# Patient Record
Sex: Female | Born: 1965 | Race: Black or African American | Hispanic: No | Marital: Single | Smoking: Current every day smoker
Health system: Southern US, Community
[De-identification: ages and names within clinical notes are randomized; demographics above are authoritative.]

## PROBLEM LIST (undated history)

## (undated) DIAGNOSIS — M1990 Unspecified osteoarthritis, unspecified site: Secondary | ICD-10-CM

## (undated) HISTORY — DX: Unspecified osteoarthritis, unspecified site: M19.90

---

## 1997-03-25 ENCOUNTER — Ambulatory Visit (HOSPITAL_COMMUNITY): Admission: RE | Admit: 1997-03-25 | Discharge: 1997-03-25 | Payer: Self-pay | Admitting: Obstetrics

## 1997-03-28 ENCOUNTER — Ambulatory Visit (HOSPITAL_COMMUNITY): Admission: RE | Admit: 1997-03-28 | Discharge: 1997-03-28 | Payer: Self-pay | Admitting: Obstetrics

## 1997-06-25 ENCOUNTER — Other Ambulatory Visit: Admission: RE | Admit: 1997-06-25 | Discharge: 1997-06-25 | Payer: Self-pay | Admitting: Obstetrics

## 1997-07-15 ENCOUNTER — Inpatient Hospital Stay (HOSPITAL_COMMUNITY): Admission: AD | Admit: 1997-07-15 | Discharge: 1997-07-17 | Payer: Self-pay | Admitting: Obstetrics

## 2011-05-27 ENCOUNTER — Encounter (HOSPITAL_COMMUNITY): Payer: Self-pay | Admitting: Emergency Medicine

## 2011-05-27 ENCOUNTER — Emergency Department (HOSPITAL_COMMUNITY)
Admission: EM | Admit: 2011-05-27 | Discharge: 2011-05-27 | Disposition: A | Discharge status: Home / Self Care | Payer: Self-pay | Attending: Emergency Medicine | Admitting: Emergency Medicine

## 2011-05-27 DIAGNOSIS — R112 Nausea with vomiting, unspecified: Secondary | ICD-10-CM | POA: Insufficient documentation

## 2011-05-27 DIAGNOSIS — R000 Tachycardia, unspecified: Secondary | ICD-10-CM | POA: Insufficient documentation

## 2011-05-27 DIAGNOSIS — R1013 Epigastric pain: Secondary | ICD-10-CM | POA: Insufficient documentation

## 2011-05-27 LAB — COMPREHENSIVE METABOLIC PANEL
ALT: 10 U/L (ref 0–35)
AST: 16 U/L (ref 0–37)
Albumin: 4.8 g/dL (ref 3.5–5.2)
Alkaline Phosphatase: 55 U/L (ref 39–117)
BUN: 9 mg/dL (ref 6–23)
CO2: 26 mEq/L (ref 19–32)
Calcium: 10.1 mg/dL (ref 8.4–10.5)
Chloride: 100 mEq/L (ref 96–112)
Creatinine, Ser: 0.78 mg/dL (ref 0.50–1.10)
GFR calc Af Amer: >90 mL/min (ref >90)
GFR calc non Af Amer: >90 mL/min (ref >90)
Glucose, Bld: 73 mg/dL (ref 70–99)
Potassium: 3.8 mEq/L (ref 3.5–5.1)
Sodium: 138 mEq/L (ref 135–145)
Total Bilirubin: 0.5 mg/dL (ref 0.3–1.2)
Total Protein: 8.6 g/dL — ABNORMAL HIGH (ref 6.0–8.3)

## 2011-05-27 LAB — URINALYSIS, ROUTINE W REFLEX MICROSCOPIC
Bilirubin Urine: NEGATIVE
Glucose, UA: NEGATIVE mg/dL
Hgb urine dipstick: NEGATIVE
Ketones, ur: 15 mg/dL — AB
Nitrite: NEGATIVE
Protein, ur: NEGATIVE mg/dL
Specific Gravity, Urine: 1.018 (ref 1.005–1.030)
Urobilinogen, UA: 0.2 mg/dL (ref 0.0–1.0)
pH: 6.5 (ref 5.0–8.0)

## 2011-05-27 LAB — CBC
HCT: 42.6 % (ref 36.0–46.0)
Hemoglobin: 14.4 g/dL (ref 12.0–15.0)
MCH: 31.9 pg (ref 26.0–34.0)
MCHC: 33.8 g/dL (ref 30.0–36.0)
MCV: 94.2 fL (ref 78.0–100.0)
Platelets: 402 10*3/uL — ABNORMAL HIGH (ref 150–400)
RBC: 4.52 MIL/uL (ref 3.87–5.11)
RDW: 14.3 % (ref 11.5–15.5)
WBC: 12.5 10*3/uL — ABNORMAL HIGH (ref 4.0–10.5)

## 2011-05-27 LAB — POCT PREGNANCY, URINE: Preg Test, Ur: NEGATIVE

## 2011-05-27 LAB — URINE MICROSCOPIC-ADD ON

## 2011-05-27 LAB — LIPASE, BLOOD: Lipase: 44 U/L (ref 11–59)

## 2011-05-27 MED ORDER — SODIUM CHLORIDE 0.9 % IV BOLUS (SEPSIS)
1000.0000 mL | Freq: Once | INTRAVENOUS | Status: AC
  Administered 2011-05-27: 1000 mL via INTRAVENOUS

## 2011-05-27 MED ORDER — ONDANSETRON HCL 4 MG/2ML IJ SOLN
4.0000 mg | Freq: Once | INTRAMUSCULAR | Status: AC
  Administered 2011-05-27: 4 mg via INTRAVENOUS
  Filled 2011-05-27: qty 2

## 2011-05-27 MED ORDER — GI COCKTAIL ~~LOC~~
10.0000 mL | Freq: Once | ORAL | Status: AC
  Administered 2011-05-27: 10 mL via ORAL
  Filled 2011-05-27: qty 30

## 2011-05-27 MED ORDER — ONDANSETRON HCL 4 MG PO TABS: 4.0000 mg | ORAL_TABLET | Freq: Four times a day (QID) | ORAL | Status: AC

## 2011-05-27 NOTE — ED Notes (Signed)
N/v since tues states that she has abd pain to upper abd area, no sob,

## 2011-05-27 NOTE — ED Provider Notes (Addendum)
History    46yF with abdominal pain. Gradual onset 3d ago. Epigastric region with radiation to back. Relatively constant. No appreciable exacerbating or relieving factors. Multiple episodes of vomiting. Can't remember if pain or vomiting began first. No fever or chills. Some streaks of blood yesterday when vomiting which resolved. No coffee grounds. Drinks most weekends. Denies hx of pancreatitis. Denies hx of abdominal surgery. No sick contacts. No diarrhea.  CSN: [id number]  Arrival date & time 05/27/11  1236   First MD Initiated Contact with Patient 05/27/11 1456      Chief Complaint  Patient presents with  . Abdominal Pain  . Nausea  . Emesis    (Consider location/radiation/quality/duration/timing/severity/associated sxs/prior treatment) HPI  History reviewed. No pertinent past medical history.  History reviewed. No pertinent past surgical history.  No family history on file.  History  Substance Use Topics  . Smoking status: Not on file  . Smokeless tobacco: Not on file  . Alcohol Use: Not on file    OB History    Grav Para Term Preterm Abortions TAB SAB Ect Mult Living                  Review of Systems   Review of symptoms negative unless otherwise noted in HPI.   Allergies  Review of patient's allergies indicates no known allergies.  Home Medications  No current outpatient prescriptions on file.  BP 124/76  Pulse 110  Temp(Src) 98.5 F (36.9 C) (Oral)  Resp 22  SpO2 100%  Physical Exam  Nursing note and vitals reviewed. Constitutional: She appears well-developed and well-nourished. No distress.       Sitting on edge of bed. Nad.   HENT:  Head: Normocephalic and atraumatic.  Eyes: Conjunctivae are normal. Right eye exhibits no discharge. Left eye exhibits no discharge.  Neck: Neck supple.  Cardiovascular: Regular rhythm and normal heart sounds.  Exam reveals no gallop and no friction rub.   No murmur heard.      mildly tachy with reg rhythm.    Pulmonary/Chest: Effort normal and breath sounds normal. No respiratory distress.  Abdominal: Soft. She exhibits no distension. There is tenderness.       Mild tenderness in epigastrium without guarding or rebound. No distension.  Genitourinary:       No cva tenderness  Musculoskeletal: She exhibits no edema and no tenderness.  Neurological: She is alert.  Skin: Skin is warm and dry. She is not diaphoretic.  Psychiatric: She has a normal mood and affect. Her behavior is normal. Thought content normal.    ED Course  Procedures (including critical care time)  Labs Reviewed  URINALYSIS, ROUTINE W REFLEX MICROSCOPIC - Abnormal; Notable for the following:    APPearance CLOUDY (*)    Ketones, ur 15 (*)    Leukocytes, UA MODERATE (*)    All other components within normal limits  COMPREHENSIVE METABOLIC PANEL - Abnormal; Notable for the following:    Total Protein 8.6 (*)    All other components within normal limits  CBC - Abnormal; Notable for the following:    WBC 12.5 (*)    Platelets 402 (*)    All other components within normal limits  URINE MICROSCOPIC-ADD ON - Abnormal; Notable for the following:    Squamous Epithelial / LPF FEW (*)    All other components within normal limits  POCT PREGNANCY, URINE  LIPASE, BLOOD  URINALYSIS, ROUTINE W REFLEX MICROSCOPIC   No results found.   1. Abdominal pain  2. Nausea and vomiting    4:34 PM Pt reassessed. Feels much better. No new complaints. Repeat abdominal exam unchanged.   EKG:  Rhythm:nsr Rate: 68 Axis: normal Intervals: normal ST segments: normal  MDM  46yf with abdominal pain and n/v. Suspect viral illness. Low suspicion for surgical abdomen. Pt reports feeling better after meds. W/u failry unremarkable . Return precautions discussed. Plan symptomatic tx. Zofran for n/v. outpt fu.        Raeford Razor, MD 05/27/11 1635  Raeford Razor, MD 05/27/11 1705

## 2011-05-27 NOTE — Discharge Instructions (Signed)
Abdominal Pain Abdominal pain can be caused by many things. Your caregiver decides the seriousness of your pain by an examination and possibly blood tests and X-rays. Many cases can be observed and treated at home. Most abdominal pain is not caused by a disease and will probably improve without treatment. However, in many cases, more time must pass before a clear cause of the pain can be found. Before that point, it may not be known if you need more testing, or if hospitalization or surgery is needed. HOME CARE INSTRUCTIONS   Do not take laxatives unless directed by your caregiver.   Take pain medicine only as directed by your caregiver.   Only take over-the-counter or prescription medicines for pain, discomfort, or fever as directed by your caregiver.   Try a clear liquid diet (broth, tea, or water) for as long as directed by your caregiver. Slowly move to a bland diet as tolerated.  SEEK IMMEDIATE MEDICAL CARE IF:   The pain does not go away.   You have a fever.   You keep throwing up (vomiting).   The pain is felt only in portions of the abdomen. Pain in the right side could possibly be appendicitis. In an adult, pain in the left lower portion of the abdomen could be colitis or diverticulitis.   You pass bloody or black tarry stools.  MAKE SURE YOU:   Understand these instructions.   Will watch your condition.   Will get help right away if you are not doing well or get worse.  Document Released: 11/10/2004 Document Revised: 01/20/2011 Document Reviewed: 09/19/2007 ExitCare Patient Information 2012 [employer].Nausea and Vomiting Nausea means you feel sick to your stomach. Throwing up (vomiting) is a reflex where stomach contents come out of your mouth. HOME CARE   Take medicine as told by your doctor.   Do not force yourself to eat. However, you do need to drink fluids.   If you feel like eating, eat a normal diet as told by your doctor.   Eat rice, wheat, potatoes,  bread, lean meats, yogurt, fruits, and vegetables.   Avoid high-fat foods.   Drink enough fluids to keep your pee (urine) clear or pale yellow.   Ask your doctor how to replace body fluid losses (rehydrate). Signs of body fluid loss (dehydration) include:   Feeling very thirsty.   Dry lips and mouth.   Feeling dizzy.   Dark pee.   Peeing less than normal.   Feeling confused.   Fast breathing or heart rate.  GET HELP RIGHT AWAY IF:   You have blood in your throw up.   You have black or bloody poop (stool).   You have a bad headache or stiff neck.   You feel confused.   You have bad belly (abdominal) pain.   You have chest pain or trouble breathing.   You do not pee at least once every 8 hours.   You have cold, clammy skin.   You keep throwing up after 24 to 48 hours.   You have a fever.  MAKE SURE YOU:   Understand these instructions.   Will watch your condition.   Will get help right away if you are not doing well or get worse.  Document Released: 07/20/2007 Document Revised: 01/20/2011 Document Reviewed: 07/02/2010 ExitCare Patient Information 2012 [employer].  RESOURCE GUIDE  Dental Problems  Patients with Medicaid: [facility]                       [facility] [address]                                           [address] Phone:  [phone]                                                  Phone:  [phone]  If unable to pay or uninsured, contact:  [facility] or [facility] to become qualified for the adult dental clinic.  Chronic Pain Problems Contact [facility]  [phone] Patients need to be referred by their primary care doctor.  Insufficient Money for Medicine Contact [employer]:  call "211" or [facility] 271-5999.  No Primary Care Doctor Call [facility]  832-8000 Other agencies that provide inexpensive medical care    [facility]   [phone]    [facility]  [phone]    [facility]  [phone]    [facility] Clinic  [phone]    [facility]  [phone]    [facility]  [phone]  Psychological Services [facility]  [phone] [facility]  [phone] [facility]   800 853-5163 (emergency services 641-4993)  Substance Abuse Resources [facility]  336-882-2125 [facility] 336-784-9470 [facility] 336-285-9073 [facility] 336-845-3988 Residential & Outpatient Substance Abuse Program  800-659-3381  Abuse/Neglect [place] Child Abuse Hotline (336) 641-3795 [place] Child Abuse Hotline 800-378-5315 (After Hours)  Emergency Shelter [facility] (336) 271-5985  [facility] (336) 275-9566 [facility] (704) 372-4663  MRSA Hotline #:   832-7006    [place] Resources  [facility] [place]     [employer]                          [facility] [address] [place]                       [address]      [address]  [place]                                                [place]                            [place] Phone:  [phone]                                   Phone:  [phone]                 Phone:  [phone]  [facility] Phone:  [phone]  [place] Child Abuse Hotline (336) 342-1394 (336) 342-3537 (After Hours)   

## 2011-05-27 NOTE — Progress Notes (Signed)
Pt listed as self pay with no insurance coverage Pt confirms she is self pay [place] resident.  CM and [facility] community liaison spoke with her Pt offered [facility] services to assist with finding a [place] self pay provider She accepted information

## 2012-10-26 ENCOUNTER — Emergency Department (HOSPITAL_COMMUNITY): Payer: Self-pay

## 2012-10-26 ENCOUNTER — Emergency Department (HOSPITAL_COMMUNITY)
Admission: EM | Admit: 2012-10-26 | Discharge: 2012-10-27 | Disposition: A | Discharge status: Home / Self Care | Payer: Self-pay | Attending: Emergency Medicine | Admitting: Emergency Medicine

## 2012-10-26 ENCOUNTER — Encounter (HOSPITAL_COMMUNITY): Payer: Self-pay | Admitting: Emergency Medicine

## 2012-10-26 DIAGNOSIS — R112 Nausea with vomiting, unspecified: Secondary | ICD-10-CM | POA: Insufficient documentation

## 2012-10-26 DIAGNOSIS — R197 Diarrhea, unspecified: Secondary | ICD-10-CM | POA: Insufficient documentation

## 2012-10-26 DIAGNOSIS — A599 Trichomoniasis, unspecified: Secondary | ICD-10-CM | POA: Insufficient documentation

## 2012-10-26 DIAGNOSIS — R1013 Epigastric pain: Secondary | ICD-10-CM | POA: Insufficient documentation

## 2012-10-26 DIAGNOSIS — R6883 Chills (without fever): Secondary | ICD-10-CM | POA: Insufficient documentation

## 2012-10-26 DIAGNOSIS — F17200 Nicotine dependence, unspecified, uncomplicated: Secondary | ICD-10-CM | POA: Insufficient documentation

## 2012-10-26 DIAGNOSIS — N898 Other specified noninflammatory disorders of vagina: Secondary | ICD-10-CM | POA: Insufficient documentation

## 2012-10-26 LAB — URINE MICROSCOPIC-ADD ON

## 2012-10-26 LAB — CBC WITH DIFFERENTIAL/PLATELET
Basophils Absolute: 0 10*3/uL (ref 0.0–0.1)
Eosinophils Absolute: 0.1 10*3/uL (ref 0.0–0.7)
Eosinophils Relative: 1 % (ref 0–5)
HCT: 46.9 % — ABNORMAL HIGH (ref 36.0–46.0)
Lymphocytes Relative: 24 % (ref 12–46)
Lymphs Abs: 2.5 10*3/uL (ref 0.7–4.0)
MCH: 32.5 pg (ref 26.0–34.0)
MCV: 93.4 fL (ref 78.0–100.0)
Monocytes Absolute: 0.6 10*3/uL (ref 0.1–1.0)
Platelets: 313 10*3/uL (ref 150–400)
RDW: 14.3 % (ref 11.5–15.5)
WBC: 10.5 10*3/uL (ref 4.0–10.5)

## 2012-10-26 LAB — COMPREHENSIVE METABOLIC PANEL
CO2: 26 mEq/L (ref 19–32)
Calcium: 9.6 mg/dL (ref 8.4–10.5)
Creatinine, Ser: 0.7 mg/dL (ref 0.50–1.10)
GFR calc Af Amer: >90 mL/min (ref >90)
GFR calc non Af Amer: >90 mL/min (ref >90)
Glucose, Bld: 95 mg/dL (ref 70–99)
Total Protein: 8 g/dL (ref 6.0–8.3)

## 2012-10-26 LAB — URINALYSIS, ROUTINE W REFLEX MICROSCOPIC
Nitrite: NEGATIVE
Specific Gravity, Urine: 1.028 (ref 1.005–1.030)
Urobilinogen, UA: 1 mg/dL (ref 0.0–1.0)
pH: 7.5 (ref 5.0–8.0)

## 2012-10-26 LAB — WET PREP, GENITAL
Clue Cells Wet Prep HPF POC: NONE SEEN
Yeast Wet Prep HPF POC: NONE SEEN

## 2012-10-26 MED ORDER — METRONIDAZOLE 500 MG PO TABS: 500.0000 mg | ORAL_TABLET | Freq: Two times a day (BID) | ORAL | Status: DC

## 2012-10-26 MED ORDER — ONDANSETRON HCL 4 MG PO TABS: 4.0000 mg | ORAL_TABLET | Freq: Four times a day (QID) | ORAL | Status: DC

## 2012-10-26 MED ORDER — ONDANSETRON HCL 4 MG/2ML IJ SOLN
4.0000 mg | Freq: Once | INTRAMUSCULAR | Status: AC
  Administered 2012-10-26: 4 mg via INTRAVENOUS
  Filled 2012-10-26: qty 2

## 2012-10-26 MED ORDER — MORPHINE SULFATE 4 MG/ML IJ SOLN
4.0000 mg | Freq: Once | INTRAMUSCULAR | Status: AC
  Administered 2012-10-26: 4 mg via INTRAVENOUS
  Filled 2012-10-26: qty 1

## 2012-10-26 MED ORDER — METRONIDAZOLE 500 MG PO TABS: 2000.0000 mg | ORAL_TABLET | Freq: Once | ORAL | Status: DC

## 2012-10-26 MED ORDER — OMEPRAZOLE 20 MG PO CPDR: 20.0000 mg | DELAYED_RELEASE_CAPSULE | Freq: Every day | ORAL | Status: DC

## 2012-10-26 NOTE — Progress Notes (Signed)
Patient confirms that she does not have a pcp or insurance.  EDCM provided a list of pcps who accept self pay patients, list of financial assistance in the community such as local churches and [employer],  list of discounted pharmacies and website needymeds.org for medication assistance, and information on Medicaid , Affordable Care Act and Inclusive insurance for insurance.  Patient thankful for resources.  No further needs at thid time.

## 2012-10-26 NOTE — ED Notes (Signed)
PA at bedside.

## 2012-10-26 NOTE — ED Notes (Addendum)
Pelvic exam set up and pt. undress

## 2012-10-26 NOTE — ED Notes (Addendum)
Pt reports nausea, emesis, and diarrhea that has occured since Wednesday. Pt also reports abd pain. Pt states that she is unable to keep down fluids. Pt is A/O x4 and in NAD.

## 2012-10-26 NOTE — ED Provider Notes (Signed)
CSN: [id number]     Arrival date & time 10/26/12  1649 History   First MD Initiated Contact with Patient 10/26/12 1816     Chief Complaint  Patient presents with  . Abdominal Pain  . Emesis   (Consider location/radiation/quality/duration/timing/severity/associated sxs/prior Treatment) HPI Comments: Patient presents emergency department with chief complaints of nausea, vomiting, diarrhea. Patient states the symptoms began on Monday, but it recently worsened Wednesday. She states that the pain is worse when she is moving. She states that she is unable to keep down any food or fluids. She denies fever, but states that she has had some chills. She states that she has noticed some mild blood-tinged vomit. Additionally, she endorses cough, productive  for green sputum. She also endorses a few episodes of diarrhea, and new vaginal discharge. She has not tried taking anything to alleviate her symptoms.  The history is provided by the patient. No language interpreter was used.    History reviewed. No pertinent past medical history. History reviewed. No pertinent past surgical history. No family history on file. History  Substance Use Topics  . Smoking status: Current Every Day Smoker -- 0.50 packs/day for 10 years    Types: Cigarettes  . Smokeless tobacco: Never Used  . Alcohol Use: No   OB History   Grav Para Term Preterm Abortions TAB SAB Ect Mult Living                 Review of Systems  All other systems reviewed and are negative.    Allergies  Review of patient's allergies indicates no known allergies.  Home Medications   Current Outpatient Rx  Name  Route  Sig  Dispense  Refill  . aspirin-sod bicarb-citric acid (ALKA-SELTZER) 325 MG TBEF   Oral   Take 325 mg by mouth every 6 (six) hours as needed. pain         . bismuth subsalicylate (PEPTO BISMOL) 262 MG/15ML suspension   Oral   Take 15 mLs by mouth every 6 (six) hours as needed. stomach         . calcium  carbonate-magnesium hydroxide (ROLAIDS) 334 MG CHEW   Oral   Chew 1 tablet by mouth once.          BP 118/78  Pulse 67  Temp(Src) 99.1 F (37.3 C) (Oral)  Resp 16  SpO2 98%  LMP 05/26/2012 Physical Exam  Nursing note and vitals reviewed. Constitutional: She is oriented to person, place, and time. She appears well-developed and well-nourished.  HENT:  Head: Normocephalic and atraumatic.  Eyes: Conjunctivae and EOM are normal. Pupils are equal, round, and reactive to light.  Neck: Normal range of motion. Neck supple.  Cardiovascular: Normal rate and regular rhythm.  Exam reveals no gallop and no friction rub.   No murmur heard. Pulmonary/Chest: Effort normal and breath sounds normal. No respiratory distress. She has no wheezes. She has no rales. She exhibits no tenderness.  Abdominal: Soft. Bowel sounds are normal. She exhibits no distension and no mass. There is no tenderness. There is no rebound and no guarding.  Mid epigastric abdominal tenderness, no other focal abdominal tenderness, no fluid wave, or signs of peritonitis, no rebound, guarding, or masses  Genitourinary: No labial fusion. There is no rash, tenderness, lesion or injury on the right labia. There is no rash, tenderness, lesion or injury on the left labia. Cervix exhibits no motion tenderness, no discharge and no friability. Right adnexum displays no mass, no tenderness and no fullness.  Left adnexum displays no mass, no tenderness and no fullness. No erythema, tenderness or bleeding around the vagina. No foreign body around the vagina. No signs of injury around the vagina. Vaginal discharge found.  Chaperone present during exam, copious white discharge  Musculoskeletal: Normal range of motion. She exhibits no edema and no tenderness.  Neurological: She is alert and oriented to person, place, and time.  Skin: Skin is warm and dry.  Psychiatric: She has a normal mood and affect. Her behavior is normal. Judgment and thought  content normal.    ED Course  Procedures (including critical care time) Labs Review Labs Reviewed  CBC WITH DIFFERENTIAL  COMPREHENSIVE METABOLIC PANEL  URINALYSIS, ROUTINE W REFLEX MICROSCOPIC   Results for orders placed during the hospital encounter of 10/26/12  WET PREP, GENITAL      Result Value Range   Yeast Wet Prep HPF POC NONE SEEN  NONE SEEN   Trich, Wet Prep FEW (*) NONE SEEN   Clue Cells Wet Prep HPF POC NONE SEEN  NONE SEEN   WBC, Wet Prep HPF POC FEW (*) NONE SEEN  CBC WITH DIFFERENTIAL      Result Value Range   WBC 10.5  4.0 - 10.5 K/uL   RBC 5.02  3.87 - 5.11 MIL/uL   Hemoglobin 16.3 (*) 12.0 - 15.0 g/dL   HCT 45.4 (*) 09.8 - 11.9 %   MCV 93.4  78.0 - 100.0 fL   MCH 32.5  26.0 - 34.0 pg   MCHC 34.8  30.0 - 36.0 g/dL   RDW 14.7  82.9 - 56.2 %   Platelets 313  150 - 400 K/uL   Neutrophils Relative % 69  43 - 77 %   Neutro Abs 7.3  1.7 - 7.7 K/uL   Lymphocytes Relative 24  12 - 46 %   Lymphs Abs 2.5  0.7 - 4.0 K/uL   Monocytes Relative 6  3 - 12 %   Monocytes Absolute 0.6  0.1 - 1.0 K/uL   Eosinophils Relative 1  0 - 5 %   Eosinophils Absolute 0.1  0.0 - 0.7 K/uL   Basophils Relative 0  0 - 1 %   Basophils Absolute 0.0  0.0 - 0.1 K/uL  COMPREHENSIVE METABOLIC PANEL      Result Value Range   Sodium 139  135 - 145 mEq/L   Potassium 4.3  3.5 - 5.1 mEq/L   Chloride 101  96 - 112 mEq/L   CO2 26  19 - 32 mEq/L   Glucose, Bld 95  70 - 99 mg/dL   BUN 8  6 - 23 mg/dL   Creatinine, Ser 1.30  0.50 - 1.10 mg/dL   Calcium 9.6  8.4 - 86.5 mg/dL   Total Protein 8.0  6.0 - 8.3 g/dL   Albumin 4.4  3.5 - 5.2 g/dL   AST 21  0 - 37 U/L   ALT 12  0 - 35 U/L   Alkaline Phosphatase 55  39 - 117 U/L   Total Bilirubin 0.5  0.3 - 1.2 mg/dL   GFR calc non Af Amer >90  >90 mL/min   GFR calc Af Amer >90  >90 mL/min  URINALYSIS, ROUTINE W REFLEX MICROSCOPIC      Result Value Range   Color, Urine AMBER (*) YELLOW   APPearance TURBID (*) CLEAR   Specific Gravity, Urine 1.028   1.005 - 1.030   pH 7.5  5.0 - 8.0   Glucose, UA NEGATIVE  NEGATIVE mg/dL   Hgb urine dipstick NEGATIVE  NEGATIVE   Bilirubin Urine SMALL (*) NEGATIVE   Ketones, ur >80 (*) NEGATIVE mg/dL   Protein, ur NEGATIVE  NEGATIVE mg/dL   Urobilinogen, UA 1.0  0.0 - 1.0 mg/dL   Nitrite NEGATIVE  NEGATIVE   Leukocytes, UA SMALL (*) NEGATIVE  LIPASE, BLOOD      Result Value Range   Lipase 38  11 - 59 U/L  URINE MICROSCOPIC-ADD ON      Result Value Range   Squamous Epithelial / LPF RARE  RARE   WBC, UA 0-2  <3 WBC/hpf   Bacteria, UA MANY (*) RARE   Urine-Other AMORPHOUS URATES/PHOSPHATES     Dg Abd Acute W/chest  10/26/2012   CLINICAL DATA:  Nausea, diarrhea  EXAM: ACUTE ABDOMEN SERIES (ABDOMEN 2 VIEW & CHEST 1 VIEW)  COMPARISON:  None.  FINDINGS: Cardiomediastinal silhouette is unremarkable. No acute infiltrate or pleural effusion. No pulmonary edema.  There is nonspecific nonobstructive bowel gas pattern. Some stool are noted in transverse colon and proximal left colon. No pathologic calcifications. No free abdominal air.  IMPRESSION: No acute disease. Nonspecific nonobstructive bowel gas pattern. No free abdominal air.   Electronically Signed   By: Natasha Mead   On: 10/26/2012 19:51     MDM   1. Epigastric pain   2. Trichimoniasis    Patient with several problems, including nausea, vomiting, diarrhea, productive cough, and vaginal discharge. Will check labs, and will reevaluate. Patient appears stable. She is not in any apparent distress. Will give Zofran.  Patient's workup is reassuring. The abdominal pain has persisted in the epigastric region, she did not have any lower abdominal tenderness. Do not suspect surgical abdomen. I feel that a watch and wait. He is appropriate. Vital signs are stable. Patient is not in apparent distress. She is tolerating oral intake.  Patient discussed with Dr. Fayrene Fearing, who has reviewed the workup and labs, and agrees that symptomatic treatment is appropriate at  this time.  Will treat nausea and trich, and recommend outpatient follow-up.  Roxy Horseman, PA-C 10/27/12 0021

## 2012-10-26 NOTE — ED Notes (Signed)
Pt vomiting presently, no fluid challenge not performed

## 2012-10-26 NOTE — ED Notes (Signed)
Fluid challenge cancelled.  Patient actively vomiting.

## 2012-10-27 ENCOUNTER — Encounter (HOSPITAL_COMMUNITY): Payer: Self-pay

## 2012-10-27 ENCOUNTER — Emergency Department (HOSPITAL_COMMUNITY)
Admission: EM | Admit: 2012-10-27 | Discharge: 2012-10-28 | Disposition: A | Discharge status: Home / Self Care | Payer: Self-pay | Attending: Emergency Medicine | Admitting: Emergency Medicine

## 2012-10-27 DIAGNOSIS — F17200 Nicotine dependence, unspecified, uncomplicated: Secondary | ICD-10-CM | POA: Insufficient documentation

## 2012-10-27 DIAGNOSIS — Z79899 Other long term (current) drug therapy: Secondary | ICD-10-CM | POA: Insufficient documentation

## 2012-10-27 DIAGNOSIS — Z3202 Encounter for pregnancy test, result negative: Secondary | ICD-10-CM | POA: Insufficient documentation

## 2012-10-27 DIAGNOSIS — R112 Nausea with vomiting, unspecified: Secondary | ICD-10-CM | POA: Insufficient documentation

## 2012-10-27 DIAGNOSIS — E860 Dehydration: Secondary | ICD-10-CM | POA: Insufficient documentation

## 2012-10-27 LAB — URINALYSIS, ROUTINE W REFLEX MICROSCOPIC
Ketones, ur: >80 mg/dL — AB
Nitrite: NEGATIVE
pH: 7 (ref 5.0–8.0)

## 2012-10-27 LAB — COMPREHENSIVE METABOLIC PANEL
BUN: 11 mg/dL (ref 6–23)
CO2: 28 mEq/L (ref 19–32)
Chloride: 98 mEq/L (ref 96–112)
Creatinine, Ser: 0.77 mg/dL (ref 0.50–1.10)
GFR calc non Af Amer: >90 mL/min (ref >90)
Total Bilirubin: 0.5 mg/dL (ref 0.3–1.2)

## 2012-10-27 LAB — CBC
HCT: 48.5 % — ABNORMAL HIGH (ref 36.0–46.0)
MCV: 92.9 fL (ref 78.0–100.0)
RBC: 5.22 MIL/uL — ABNORMAL HIGH (ref 3.87–5.11)
WBC: 11.7 10*3/uL — ABNORMAL HIGH (ref 4.0–10.5)

## 2012-10-27 LAB — LIPASE, BLOOD: Lipase: 36 U/L (ref 11–59)

## 2012-10-27 LAB — URINE MICROSCOPIC-ADD ON

## 2012-10-27 LAB — TROPONIN I: Troponin I: <0.3 ng/mL (ref <0.30)

## 2012-10-27 MED ORDER — PANTOPRAZOLE SODIUM 40 MG IV SOLR
40.0000 mg | Freq: Once | INTRAVENOUS | Status: AC
  Administered 2012-10-27: 40 mg via INTRAVENOUS
  Filled 2012-10-27: qty 40

## 2012-10-27 MED ORDER — TRAMADOL HCL 50 MG PO TABS: 50.0000 mg | ORAL_TABLET | Freq: Four times a day (QID) | ORAL | Status: DC | PRN

## 2012-10-27 MED ORDER — GI COCKTAIL ~~LOC~~
30.0000 mL | Freq: Once | ORAL | Status: AC
  Administered 2012-10-27: 30 mL via ORAL
  Filled 2012-10-27: qty 30

## 2012-10-27 MED ORDER — CEPHALEXIN 500 MG PO CAPS
500.0000 mg | ORAL_CAPSULE | Freq: Once | ORAL | Status: AC
  Administered 2012-10-28: 500 mg via ORAL
  Filled 2012-10-27: qty 1

## 2012-10-27 MED ORDER — ONDANSETRON 4 MG PO TBDP
4.0000 mg | ORAL_TABLET | Freq: Once | ORAL | Status: AC
  Administered 2012-10-27: 4 mg via ORAL
  Filled 2012-10-27: qty 1

## 2012-10-27 MED ORDER — ONDANSETRON HCL 4 MG/2ML IJ SOLN
4.0000 mg | Freq: Once | INTRAMUSCULAR | Status: AC
  Administered 2012-10-27: 4 mg via INTRAVENOUS
  Filled 2012-10-27: qty 2

## 2012-10-27 MED ORDER — FAMOTIDINE 20 MG PO TABS
20.0000 mg | ORAL_TABLET | Freq: Once | ORAL | Status: AC
  Administered 2012-10-27: 20 mg via ORAL
  Filled 2012-10-27: qty 1

## 2012-10-27 MED ORDER — SODIUM CHLORIDE 0.9 % IV BOLUS (SEPSIS)
1000.0000 mL | Freq: Once | INTRAVENOUS | Status: AC
  Administered 2012-10-27: 1000 mL via INTRAVENOUS

## 2012-10-27 MED ORDER — SODIUM CHLORIDE 0.9 % IV BOLUS (SEPSIS)
1000.0000 mL | Freq: Once | INTRAVENOUS | Status: AC
  Administered 2012-10-28: 1000 mL via INTRAVENOUS

## 2012-10-27 MED ORDER — MORPHINE SULFATE 4 MG/ML IJ SOLN
4.0000 mg | Freq: Once | INTRAMUSCULAR | Status: AC
  Administered 2012-10-27: 4 mg via INTRAVENOUS
  Filled 2012-10-27: qty 1

## 2012-10-27 NOTE — ED Notes (Signed)
Patient is alert and oriented x3.  She was given DC instructions and follow up visit instructions.  Patient gave verbal understanding. She was DC ambulatory under her own power to home.  V/S stable.  He was not showing any signs of distress on DC 

## 2012-10-27 NOTE — ED Provider Notes (Signed)
CSN: [id number]     Arrival date & time 10/27/12  1957 History   First MD Initiated Contact with Patient 10/27/12 2026     Chief Complaint  Patient presents with  . Emesis  . Abdominal Pain   (Consider location/radiation/quality/duration/timing/severity/associated sxs/prior Treatment) Patient is a 47 y.o. female presenting with vomiting and abdominal pain. The history is provided by the patient.  Emesis Associated symptoms: abdominal pain   Associated symptoms: no chills and no headaches   Abdominal Pain Associated symptoms: vomiting   Associated symptoms: no chest pain, no chills, no fever and no shortness of breath   pt c/o nv onset yesterday. Episodic. Several episodes today, yellowish/clear. No bloody emesis. Epigastric pain, dull, cramping/burning. No radiation. Moderate. No abd distension. Denies any prior abd surgery. No fever or chills. Having normal bms, no diarrhea or constipation. No dysuria, hematuria or gu c/o. No lower abd pain or pelvic pain. No vaginal discharge or bleeding. Indicates post-menopausal. Denies hx same symptoms (other than being in ed yesterday for same). No known ill contacts, travel or bad food ingestion. Denies hx pud. No hx gallstones or fam hx gallstones. No hx pancreatitis. Denies etoh use. No cp or sob.      History reviewed. No pertinent past medical history. History reviewed. No pertinent past surgical history. History reviewed. No pertinent family history. History  Substance Use Topics  . Smoking status: Current Every Day Smoker -- 0.50 packs/day for 10 years    Types: Cigarettes  . Smokeless tobacco: Never Used  . Alcohol Use: No   OB History   Grav Para Term Preterm Abortions TAB SAB Ect Mult Living                 Review of Systems  Constitutional: Negative for fever and chills.  HENT: Negative for neck pain.   Eyes: Negative for redness.  Respiratory: Negative for shortness of breath.   Cardiovascular: Negative for chest pain.   Gastrointestinal: Positive for vomiting and abdominal pain.  Genitourinary: Negative for flank pain.  Musculoskeletal: Negative for back pain.  Skin: Negative for rash.  Neurological: Negative for headaches.  Hematological: Does not bruise/bleed easily.  Psychiatric/Behavioral: Negative for confusion.    Allergies  Review of patient's allergies indicates no known allergies.  Home Medications   Current Outpatient Rx  Name  Route  Sig  Dispense  Refill  . aspirin-sod bicarb-citric acid (ALKA-SELTZER) 325 MG TBEF   Oral   Take 325 mg by mouth every 6 (six) hours as needed. pain         . ibuprofen (ADVIL,MOTRIN) 200 MG tablet   Oral   Take 200 mg by mouth every 6 (six) hours as needed for pain.         . magnesium hydroxide (MILK OF MAGNESIA) 400 MG/5ML suspension   Oral   Take 15 mLs by mouth daily as needed for constipation.         . metroNIDAZOLE (FLAGYL) 500 MG tablet   Oral   Take 1 tablet (500 mg total) by mouth 2 (two) times daily.   14 tablet   0   . ondansetron (ZOFRAN) 4 MG tablet   Oral   Take 1 tablet (4 mg total) by mouth every 6 (six) hours.   12 tablet   0   . omeprazole (PRILOSEC) 20 MG capsule   Oral   Take 1 capsule (20 mg total) by mouth daily.   30 capsule   1    BP  121/70  Pulse 77  Temp(Src) 98.8 F (37.1 C) (Oral)  Resp 18  Ht 5\' 4"  (1.626 m)  SpO2 100%  LMP 05/26/2012 Physical Exam  Nursing note and vitals reviewed. Constitutional: She appears well-developed and well-nourished. No distress.  HENT:  Mouth/Throat: Oropharynx is clear and moist.  Eyes: Conjunctivae are normal. No scleral icterus.  Neck: Neck supple. No tracheal deviation present.  Cardiovascular: Normal rate, regular rhythm, normal heart sounds and intact distal pulses.   Pulmonary/Chest: Effort normal and breath sounds normal. No respiratory distress.  Abdominal: Soft. Normal appearance and bowel sounds are normal. She exhibits no distension and no mass.  There is tenderness. There is no rebound and no guarding.  Epigastric tenderness, no rebound or guarding.   Genitourinary:  No cva tenderness  Musculoskeletal: She exhibits no edema and no tenderness.  Neurological: She is alert.  Skin: Skin is warm and dry. No rash noted.  Psychiatric: She has a normal mood and affect.    ED Course  Procedures (including critical care time) Labs Review  Results for orders placed during the hospital encounter of 10/27/12  CBC      Result Value Range   WBC 11.7 (*) 4.0 - 10.5 K/uL   RBC 5.22 (*) 3.87 - 5.11 MIL/uL   Hemoglobin 16.7 (*) 12.0 - 15.0 g/dL   HCT 78.2 (*) 95.6 - 21.3 %   MCV 92.9  78.0 - 100.0 fL   MCH 32.0  26.0 - 34.0 pg   MCHC 34.4  30.0 - 36.0 g/dL   RDW 08.6  57.8 - 46.9 %   Platelets 352  150 - 400 K/uL  COMPREHENSIVE METABOLIC PANEL      Result Value Range   Sodium 140  135 - 145 mEq/L   Potassium 3.7  3.5 - 5.1 mEq/L   Chloride 98  96 - 112 mEq/L   CO2 28  19 - 32 mEq/L   Glucose, Bld 115 (*) 70 - 99 mg/dL   BUN 11  6 - 23 mg/dL   Creatinine, Ser 6.29  0.50 - 1.10 mg/dL   Calcium 52.8  8.4 - 41.3 mg/dL   Total Protein 8.3  6.0 - 8.3 g/dL   Albumin 4.6  3.5 - 5.2 g/dL   AST 15  0 - 37 U/L   ALT 11  0 - 35 U/L   Alkaline Phosphatase 57  39 - 117 U/L   Total Bilirubin 0.5  0.3 - 1.2 mg/dL   GFR calc non Af Amer >90  >90 mL/min   GFR calc Af Amer >90  >90 mL/min  LIPASE, BLOOD      Result Value Range   Lipase 36  11 - 59 U/L  TROPONIN I      Result Value Range   Troponin I <0.30  <0.30 ng/mL  URINALYSIS, ROUTINE W REFLEX MICROSCOPIC      Result Value Range   Color, Urine AMBER (*) YELLOW   APPearance CLOUDY (*) CLEAR   Specific Gravity, Urine 1.035 (*) 1.005 - 1.030   pH 7.0  5.0 - 8.0   Glucose, UA NEGATIVE  NEGATIVE mg/dL   Hgb urine dipstick NEGATIVE  NEGATIVE   Bilirubin Urine SMALL (*) NEGATIVE   Ketones, ur >80 (*) NEGATIVE mg/dL   Protein, ur 30 (*) NEGATIVE mg/dL   Urobilinogen, UA 1.0  0.0 - 1.0  mg/dL   Nitrite NEGATIVE  NEGATIVE   Leukocytes, UA MODERATE (*) NEGATIVE  PREGNANCY, URINE  Result Value Range   Preg Test, Ur NEGATIVE  NEGATIVE  URINE MICROSCOPIC-ADD ON      Result Value Range   Squamous Epithelial / LPF FEW (*) RARE   WBC, UA 3-6  <3 WBC/hpf   RBC / HPF 0-2  <3 RBC/hpf   Bacteria, UA MANY (*) RARE   Urine-Other MUCOUS PRESENT     Dg Abd Acute W/chest  10/26/2012   CLINICAL DATA:  Nausea, diarrhea  EXAM: ACUTE ABDOMEN SERIES (ABDOMEN 2 VIEW & CHEST 1 VIEW)  COMPARISON:  None.  FINDINGS: Cardiomediastinal silhouette is unremarkable. No acute infiltrate or pleural effusion. No pulmonary edema.  There is nonspecific nonobstructive bowel gas pattern. Some stool are noted in transverse colon and proximal left colon. No pathologic calcifications. No free abdominal air.  IMPRESSION: No acute disease. Nonspecific nonobstructive bowel gas pattern. No free abdominal air.   Electronically Signed   By: Natasha Mead   On: 10/26/2012 19:51      MDM  Iv ns bolus. protonix iv. zofran iv. Morphine iv.  Labs.  Reviewed nursing notes and prior charts for additional history.    Recheck no nv. Tolerating po.  Pt states will need a pain rx for home.  Recheck abd soft nt. Afeb.    Date: 10/27/2012  Rate: 60  Rhythm: normal sinus rhythm  QRS Axis: normal  Intervals: normal  ST/T Wave abnormalities: normal  Conduction Disutrbances:none  Narrative Interpretation:   Old EKG Reviewed: none available  Possible uti on labs w mod le, bact, few wbc - will cx.  No dysuria, no cva tenderness or fever. Will rx pending u cx.  Keflex po/rx.  Pt given rx zofran and prilosec yesterday - encouraged to fill rx. Pt had requested pain rx for home, ultram rx provided.   abd soft nt on recheck. Tolerating po.  Pt appears stable for d/c. Will plan recheck 24 hrs if symptoms fail to improve/resolve.     Suzi Roots, MD 10/28/12 [phone]

## 2012-10-27 NOTE — ED Notes (Signed)
Pt was seen yesterday for vomiting and abdominal pain.   Pt given meds but cannot name meds.  States she has been taking them with no relief.

## 2012-10-28 MED ORDER — CEPHALEXIN 500 MG PO CAPS: 500.0000 mg | ORAL_CAPSULE | Freq: Four times a day (QID) | ORAL | Status: DC

## 2012-10-29 LAB — URINE CULTURE: Colony Count: >=100000

## 2012-10-31 NOTE — ED Provider Notes (Signed)
Medical screening examination/treatment/procedure(s) were performed by non-physician practitioner and as supervising physician I was immediately available for consultation/collaboration.   Claudean Kinds, MD 10/31/12 [phone]

## 2017-05-12 ENCOUNTER — Encounter (HOSPITAL_COMMUNITY): Payer: Self-pay | Admitting: *Deleted

## 2017-05-12 ENCOUNTER — Other Ambulatory Visit: Payer: Self-pay

## 2017-05-12 ENCOUNTER — Emergency Department (HOSPITAL_COMMUNITY): Payer: Self-pay

## 2017-05-12 ENCOUNTER — Emergency Department (HOSPITAL_COMMUNITY)
Admission: EM | Admit: 2017-05-12 | Discharge: 2017-05-12 | Disposition: A | Discharge status: Home / Self Care | Payer: Self-pay | Attending: Emergency Medicine | Admitting: Emergency Medicine

## 2017-05-12 DIAGNOSIS — Z79899 Other long term (current) drug therapy: Secondary | ICD-10-CM | POA: Insufficient documentation

## 2017-05-12 DIAGNOSIS — F17210 Nicotine dependence, cigarettes, uncomplicated: Secondary | ICD-10-CM | POA: Insufficient documentation

## 2017-05-12 DIAGNOSIS — A084 Viral intestinal infection, unspecified: Secondary | ICD-10-CM | POA: Insufficient documentation

## 2017-05-12 LAB — URINALYSIS, ROUTINE W REFLEX MICROSCOPIC
Bilirubin Urine: NEGATIVE
GLUCOSE, UA: NEGATIVE mg/dL
HGB URINE DIPSTICK: NEGATIVE
Ketones, ur: 20 mg/dL — AB
LEUKOCYTES UA: NEGATIVE
NITRITE: NEGATIVE
PH: 5 (ref 5.0–8.0)
Protein, ur: 100 mg/dL — AB
SPECIFIC GRAVITY, URINE: 1.029 (ref 1.005–1.030)

## 2017-05-12 LAB — COMPREHENSIVE METABOLIC PANEL
ALT: 33 U/L (ref 14–54)
AST: 43 U/L — AB (ref 15–41)
Albumin: 4.4 g/dL (ref 3.5–5.0)
Alkaline Phosphatase: 61 U/L (ref 38–126)
Anion gap: 14 (ref 5–15)
BILIRUBIN TOTAL: 0.4 mg/dL (ref 0.3–1.2)
BUN: 12 mg/dL (ref 6–20)
CO2: 22 mmol/L (ref 22–32)
CREATININE: 0.84 mg/dL (ref 0.44–1.00)
Calcium: 9.8 mg/dL (ref 8.9–10.3)
Chloride: 100 mmol/L — ABNORMAL LOW (ref 101–111)
Glucose, Bld: 111 mg/dL — ABNORMAL HIGH (ref 65–99)
POTASSIUM: 3.9 mmol/L (ref 3.5–5.1)
Sodium: 136 mmol/L (ref 135–145)
TOTAL PROTEIN: 8.3 g/dL — AB (ref 6.5–8.1)

## 2017-05-12 LAB — LIPASE, BLOOD: LIPASE: 44 U/L (ref 11–51)

## 2017-05-12 LAB — CBC
HCT: 48 % — ABNORMAL HIGH (ref 36.0–46.0)
Hemoglobin: 16.6 g/dL — ABNORMAL HIGH (ref 12.0–15.0)
MCH: 32 pg (ref 26.0–34.0)
MCHC: 34.6 g/dL (ref 30.0–36.0)
MCV: 92.7 fL (ref 78.0–100.0)
PLATELETS: 291 10*3/uL (ref 150–400)
RBC: 5.18 MIL/uL — AB (ref 3.87–5.11)
RDW: 13.7 % (ref 11.5–15.5)
WBC: 9.3 10*3/uL (ref 4.0–10.5)

## 2017-05-12 LAB — I-STAT BETA HCG BLOOD, ED (MC, WL, AP ONLY): I-stat hCG, quantitative: <5 m[IU]/mL (ref <5)

## 2017-05-12 MED ORDER — ALBUTEROL SULFATE (2.5 MG/3ML) 0.083% IN NEBU
3.0000 mL | INHALATION_SOLUTION | Freq: Once | RESPIRATORY_TRACT | Status: AC
  Administered 2017-05-12: 3 mL via RESPIRATORY_TRACT
  Filled 2017-05-12: qty 3

## 2017-05-12 MED ORDER — SODIUM CHLORIDE 0.9 % IV BOLUS
1000.0000 mL | Freq: Once | INTRAVENOUS | Status: AC
  Administered 2017-05-12: 1000 mL via INTRAVENOUS

## 2017-05-12 MED ORDER — DEXAMETHASONE SODIUM PHOSPHATE 10 MG/ML IJ SOLN
10.0000 mg | Freq: Once | INTRAMUSCULAR | Status: AC
  Administered 2017-05-12: 10 mg via INTRAVENOUS

## 2017-05-12 MED ORDER — DEXAMETHASONE SODIUM PHOSPHATE 10 MG/ML IJ SOLN
INTRAMUSCULAR | Status: AC
  Filled 2017-05-12: qty 1

## 2017-05-12 MED ORDER — ONDANSETRON HCL 4 MG/2ML IJ SOLN
4.0000 mg | Freq: Once | INTRAMUSCULAR | Status: AC
  Administered 2017-05-12: 4 mg via INTRAVENOUS

## 2017-05-12 MED ORDER — ONDANSETRON 4 MG PO TBDP
4.0000 mg | ORAL_TABLET | Freq: Once | ORAL | Status: AC | PRN
  Administered 2017-05-12: 4 mg via ORAL
  Filled 2017-05-12: qty 1

## 2017-05-12 MED ORDER — ALBUTEROL SULFATE HFA 108 (90 BASE) MCG/ACT IN AERS: 2.0000 | INHALATION_SPRAY | Freq: Four times a day (QID) | RESPIRATORY_TRACT | 2 refills | Status: DC | PRN

## 2017-05-12 MED ORDER — ONDANSETRON HCL 4 MG PO TABS: 4.0000 mg | ORAL_TABLET | Freq: Every day | ORAL | 1 refills | Status: AC | PRN

## 2017-05-12 MED ORDER — DEXAMETHASONE 10 MG/ML FOR PEDIATRIC ORAL USE
10.0000 mg | Freq: Once | INTRAMUSCULAR | Status: DC
  Filled 2017-05-12 (×2): qty 1

## 2017-05-12 MED ORDER — ONDANSETRON HCL 4 MG/2ML IJ SOLN
INTRAMUSCULAR | Status: AC
  Filled 2017-05-12: qty 2

## 2017-05-12 NOTE — ED Notes (Signed)
Pt verbalized understanding discharge instructions and denies any further needs or questions at this time. VS stable, ambulatory and steady gait.   

## 2017-05-12 NOTE — ED Triage Notes (Addendum)
Pt is here with diarrhea times 24 hours and vomiting started yesterday and pain to lower abdominal side areas.  Pt reports started with cough and cold symptoms.  Pt now reporting sob

## 2017-05-12 NOTE — Discharge Instructions (Signed)
We are sending her home on nausea medication and breathing treatment.  Please have these prescriptions filled and start taking.  You may return to emergency department if you have worsening of your symptoms or other symptoms concerning to you. Please follow-up at [facility]. Take care,

## 2017-05-12 NOTE — ED Provider Notes (Signed)
[facility] EMERGENCY DEPARTMENT Provider Note   CSN: [id number] Arrival date & time: 05/12/17  5102     History   Chief Complaint Chief Complaint  Patient presents with  . Abdominal Pain  . Emesis  . Diarrhea    HPI Jennifer Costa is a 52 y.o. female from shelter with no past medical history who presents with emesis and diarrhea.  Patient reports about 10 episodes of emesis and about 3 episodes of diarrhea yesterday.  She reports some blood tinge on emesis.  Diarrhea without blood.  She reports having cold-like symptoms over the last 4 days.  She also have cough with whitish phlegm.  She denies fever, hemoptysis or chest pain.  She reports some dyspnea.  She has one episode of emesis since this morning.  He has no diarrhea today.  She is still have some nausea and poor appetite. She also reports abdominal pain from coughing a lot.  She denies recent antibiotic use or hospitalization.  Quit smoking about 2-3 years ago.  Denies alcohol or recreational drug use.  She lives in shelter.  HPI  History reviewed. No pertinent past medical history.  There are no active problems to display for this patient.   History reviewed. No pertinent surgical history.   OB History   None      Home Medications    Prior to Admission medications   Medication Sig Start Date End Date Taking? Authorizing Provider  aspirin-sod bicarb-citric acid (ALKA-SELTZER) 325 MG TBEF Take 325 mg by mouth every 6 (six) hours as needed. pain    [provider]  cephALEXin (KEFLEX) 500 MG capsule Take 1 capsule (500 mg total) by mouth 4 (four) times daily. 10/28/12   Lajean Saver, MD  ibuprofen (ADVIL,MOTRIN) 200 MG tablet Take 200 mg by mouth every 6 (six) hours as needed for pain.    [provider]  magnesium hydroxide (MILK OF MAGNESIA) 400 MG/5ML suspension Take 15 mLs by mouth daily as needed for constipation.    [provider]  metroNIDAZOLE (FLAGYL) 500 MG  tablet Take 1 tablet (500 mg total) by mouth 2 (two) times daily. 10/26/12   Montine Circle, PA-C  omeprazole (PRILOSEC) 20 MG capsule Take 1 capsule (20 mg total) by mouth daily. 10/26/12   Montine Circle, PA-C  ondansetron (ZOFRAN) 4 MG tablet Take 1 tablet (4 mg total) by mouth every 6 (six) hours. 10/26/12   Montine Circle, PA-C  traMADol (ULTRAM) 50 MG tablet Take 1 tablet (50 mg total) by mouth every 6 (six) hours as needed for pain. 10/27/12   Lajean Saver, MD    Family History No family history on file.  Social History Social History   Tobacco Use  . Smoking status: Current Every Day Smoker    Packs/day: 0.50    Years: 10.00    Pack years: 5.00    Types: Cigarettes  . Smokeless tobacco: Never Used  Substance Use Topics  . Alcohol use: No  . Drug use: No     Allergies   Patient has no known allergies.   Review of Systems Review of Systems  Constitutional: Negative for appetite change, chills, fatigue, fever and unexpected weight change.  HENT: Positive for congestion and rhinorrhea. Negative for dental problem and trouble swallowing.   Eyes: Negative for visual disturbance.  Respiratory: Positive for cough and shortness of breath. Negative for chest tightness.   Cardiovascular: Negative for chest pain, palpitations and leg swelling.  Gastrointestinal: Positive for abdominal pain,  diarrhea, nausea and vomiting. Negative for blood in stool.  Genitourinary: Negative for difficulty urinating, dysuria and hematuria.  Musculoskeletal: Negative for arthralgias and myalgias.  Skin: Negative for rash.  Neurological: Negative for dizziness and light-headedness.  Psychiatric/Behavioral: Negative for dysphoric mood.   Physical Exam Updated Vital Signs BP 123/77   Pulse (!) 113   Temp 99.3 F (37.4 C) (Oral)   Resp 16   SpO2 94%   Physical Exam GEN: appears uncomfortable. Head: normocephalic and atraumatic  Eyes: conjunctiva without injection. Sclera  anicteric. Ears: external ear, ear canal and TM normal Nares: no rhinorrhea. No swollen turbinates. No erythema of nasal mucosa Oropharynx: MMM. No erythema. No exudation or petechiae.  Uvula midline HEM: negative for cervical or periauricular lymphadenopathies CVS: RRR, nl s1 & s2, no murmurs, no edema, cap refills brisk RESP: no IWOB, good air movement bilaterally, CTAB GI: BS present & normal, soft, tenderness to palpation over RLQ but no rebound.  No palpable mass. GU: no suprapubic or CVA tenderness MSK: no focal tenderness or notable swelling SKIN: no apparent skin lesion ENDO: negative thyromegally NEURO: alert and oiented appropriately, no gross deficits   PSYCH: euthymic mood with congruent affect   ED Treatments / Results  Labs (all labs ordered are listed, but only abnormal results are displayed) Labs Reviewed  COMPREHENSIVE METABOLIC PANEL - Abnormal; Notable for the following components:      Result Value   Chloride 100 (*)    Glucose, Bld 111 (*)    Total Protein 8.3 (*)    AST 43 (*)    All other components within normal limits  CBC - Abnormal; Notable for the following components:   RBC 5.18 (*)    Hemoglobin 16.6 (*)    HCT 48.0 (*)    All other components within normal limits  LIPASE, BLOOD  URINALYSIS, ROUTINE W REFLEX MICROSCOPIC  I-STAT BETA HCG BLOOD, ED ([facility], [facility], [facility] ONLY)    EKG EKG Interpretation  Date/Time:  Friday May 12 2017 10:10:05 EDT Ventricular Rate:  119 PR Interval:  116 QRS Duration: 66 QT Interval:  322 QTC Calculation: 452 R Axis:   57 Text Interpretation:  Sinus tachycardia Biatrial enlargement Abnormal ECG Since last tracing rate faster Otherwise no significant change Confirmed by Deno Etienne [phone]) on 05/12/2017 3:31:43 PM   Radiology Dg Chest 2 View  Result Date: 05/12/2017 CLINICAL DATA:  Shortness of breath with exertion. Productive cough. EXAM: CHEST - 2 VIEW COMPARISON:  Chest x-ray dated November 23, 2015. FINDINGS: The  heart size and mediastinal contours are within normal limits. Both lungs are clear. The visualized skeletal structures are unremarkable. IMPRESSION: No active cardiopulmonary disease. Electronically Signed   By: Titus Dubin M.D.   On: 05/12/2017 10:37    Procedures Procedures (including critical care time)  Medications Ordered in ED Medications  sodium chloride 0.9 % bolus 1,000 mL (has no administration in time range)  ondansetron (ZOFRAN) injection 4 mg (has no administration in time range)  ondansetron (ZOFRAN-ODT) disintegrating tablet 4 mg (4 mg Oral Given 05/12/17 1010)     Initial Impression / Assessment and Plan / ED Course  I have reviewed the triage vital signs and the nursing notes.  Pertinent labs & imaging results that were available during my care of the patient were reviewed by me and considered in my medical decision making (see chart for details).  A 52 year old female with no significant past medical history who presented with emesis and diarrhea likely due to  viral gastroenteritis.  Blood and emesis likely due to Mallory-Weiss.  Symptoms started with viral URI about 4 days ago.  Vital signs significant for tachycardia to 110s. She she is uncomfortable but nontoxic.  Exam with dry lip and RLQ tenderness but no rebound.  He has no suprapubic or CVA tenderness.  She has no urinary symptoms either.  She was given Zofran ODT but threw it up right after.  CBC without leukocytosis.  CMP basically normal.  Checks x-ray without acute finding.  We will give normal saline bolus and IV Zofran followed by PO challenge.  Will give a trial of dexamethasone 10 mg once and albuterol inhaler for dyspnea.  Patient sleeping comfortably when I went by to evaluate. Easily arises. Reports feeling better. She still have some nausea but tolerating oral fluid. No further emesis. Breathing comfortably. Still tachycardic but improved. Tachycardia could be due to albuterol as well.  Urinalysis with  some ketones, protein and trace bacteria.  Ketonuria likely due to dehydration.  Trace bacteria likely contamination given significant amount of squamous cells in the urine.  Discharging patient on albuterol inhaler and Zofran.  Recommended follow-up wellness center.  Return precautions discussed. Final Clinical Impressions(s) / ED Diagnoses   Final diagnoses:  None    ED Discharge Orders    None       Mercy Riding, MD 05/12/17 Kelliher, Laie, DO 05/12/17 2358

## 2017-05-22 NOTE — Congregational Nurse Program (Signed)
Congregational Nurse Program Note  Date of Encounter: 05/22/2017  Past Medical History: No past medical history on file.  Encounter Details: CNP Questionnaire - 05/22/17 1457      Questionnaire   Patient Status  Not Applicable    Race  Black or African American    Location Patient Served At  [employer]    Uninsured  Uninsured (NEW 1x/quarter)    Food  No food insecurities    Housing/Utilities  No permanent housing    Transportation  Yes, need transportation assistance    Interpersonal Safety  Yes, feel physically and emotionally safe where you currently live    Medication  No medication insecurities    Medical Provider  No    Referrals  Other    ED Visit Averted  Not Applicable    Life-Saving Intervention Made  Not Applicable      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 She is only at [facility] until she can get housing for her and her grandchild. She has a job and feels safe where she is at . All services were offered and she understands that there is help if she needs further assistance. [employer] BSN CN PhD. [phone].

## 2017-05-26 ENCOUNTER — Inpatient Hospital Stay: Payer: Self-pay

## 2019-08-19 ENCOUNTER — Emergency Department (HOSPITAL_COMMUNITY): Payer: Medicaid Other

## 2019-08-19 ENCOUNTER — Encounter (HOSPITAL_COMMUNITY): Payer: Self-pay

## 2019-08-19 ENCOUNTER — Emergency Department (HOSPITAL_COMMUNITY)
Admission: EM | Admit: 2019-08-19 | Discharge: 2019-08-19 | Disposition: A | Discharge status: Home / Self Care | Payer: Medicaid Other | Attending: Emergency Medicine | Admitting: Emergency Medicine

## 2019-08-19 ENCOUNTER — Other Ambulatory Visit: Payer: Self-pay

## 2019-08-19 DIAGNOSIS — F17210 Nicotine dependence, cigarettes, uncomplicated: Secondary | ICD-10-CM | POA: Insufficient documentation

## 2019-08-19 DIAGNOSIS — Z7982 Long term (current) use of aspirin: Secondary | ICD-10-CM | POA: Insufficient documentation

## 2019-08-19 DIAGNOSIS — M25531 Pain in right wrist: Secondary | ICD-10-CM | POA: Diagnosis not present

## 2019-08-19 MED ORDER — NAPROXEN 500 MG PO TABS
500.0000 mg | ORAL_TABLET | Freq: Once | ORAL | Status: AC
  Administered 2019-08-19: 12:00:00 500 mg via ORAL
  Filled 2019-08-19: qty 1

## 2019-08-19 MED ORDER — NAPROXEN 500 MG PO TABS: 500.0000 mg | ORAL_TABLET | Freq: Two times a day (BID) | ORAL | 0 refills | Status: DC

## 2019-08-19 NOTE — ED Provider Notes (Signed)
[facility] DEPT Provider Note   CSN: [id number] Arrival date & time: 08/19/19  1113     History Chief Complaint  Patient presents with  . Wrist Pain    Jennifer Costa is a 54 y.o. female.  Jennifer Costa is a 54 y.o. female who is otherwise healthy, presents to the ED for evaluation of right wrist pain.  She reports wrist pain began on Friday.  She denies any trauma or injury to the wrist.  She reports it hurts with certain movements of the wrist.  She is able to move the hand without worsening pain.  Denies any numbness tingling or weakness in the wrist or hand.  Denies any redness swelling or warmth.  No fevers or chills.  Denies history of similar pain.  She has not taken anything to treat pain but did apply an Ace wrap, without much improvement.  She was hoping it would just go away on its own but has not improved.  She reports she does not work, does not know repetitive wrist movements or any other potential inciting event.  No previous injury or surgery.  No other aggravating or alleviating factors.        History reviewed. No pertinent past medical history.  There are no problems to display for this patient.   History reviewed. No pertinent surgical history.   OB History   No obstetric history on file.     History reviewed. No pertinent family history.  Social History   Tobacco Use  . Smoking status: Current Every Day Smoker    Packs/day: 0.50    Years: 10.00    Pack years: 5.00    Types: Cigarettes  . Smokeless tobacco: Never Used  Substance Use Topics  . Alcohol use: No  . Drug use: No    Home Medications Prior to Admission medications   Medication Sig Start Date End Date Taking? Authorizing Provider  albuterol (PROVENTIL HFA;VENTOLIN HFA) 108 (90 Base) MCG/ACT inhaler Inhale 2 puffs into the lungs every 6 (six) hours as needed for wheezing or shortness of breath. 05/12/17   Mercy Riding, MD  aspirin-sod bicarb-citric acid  (ALKA-SELTZER) 325 MG TBEF Take 325 mg by mouth every 6 (six) hours as needed. pain    [provider]  ibuprofen (ADVIL,MOTRIN) 200 MG tablet Take 200 mg by mouth every 6 (six) hours as needed for pain.    [provider]  magnesium hydroxide (MILK OF MAGNESIA) 400 MG/5ML suspension Take 15 mLs by mouth daily as needed for constipation.    [provider]  naproxen (NAPROSYN) 500 MG tablet Take 1 tablet (500 mg total) by mouth 2 (two) times daily. 08/19/19   Jacqlyn Larsen, PA-C  omeprazole (PRILOSEC) 20 MG capsule Take 1 capsule (20 mg total) by mouth daily. 10/26/12   Montine Circle, PA-C  traMADol (ULTRAM) 50 MG tablet Take 1 tablet (50 mg total) by mouth every 6 (six) hours as needed for pain. 10/27/12   Lajean Saver, MD    Allergies    Patient has no known allergies.  Review of Systems   Review of Systems  Constitutional: Negative for chills and fever.  Musculoskeletal: Positive for arthralgias. Negative for joint swelling.  Skin: Negative for color change, rash and wound.  Neurological: Negative for weakness and numbness.    Physical Exam Updated Vital Signs BP 124/84   Pulse 95   Temp 99.4 F (37.4 C) (Oral)   Resp 18   SpO2 99%  Physical Exam Vitals and nursing note reviewed.  Constitutional:      General: She is not in acute distress.    Appearance: Normal appearance. She is well-developed and normal weight. She is not ill-appearing or diaphoretic.     Comments: Well-appearing and in no distress  HENT:     Head: Normocephalic and atraumatic.  Eyes:     General:        Right eye: No discharge.        Left eye: No discharge.  Pulmonary:     Effort: Pulmonary effort is normal. No respiratory distress.  Musculoskeletal:        General: Tenderness present.     Comments: There is some tenderness noted over the right wrist but with no swelling, erythema or warmth, pain made worse with range of motion of the wrist but patient is able to move  fingers without difficulty, 5/5 grip strength and cardinal hand movements intact.  Normal sensation.  No tenderness through the forearm.  Skin:    General: Skin is warm and dry.  Neurological:     Mental Status: She is alert and oriented to person, place, and time.     Coordination: Coordination normal.  Psychiatric:        Mood and Affect: Mood normal.        Behavior: Behavior normal.     ED Results / Procedures / Treatments   Labs (all labs ordered are listed, but only abnormal results are displayed) Labs Reviewed - No data to display  EKG None  Radiology DG Wrist Complete Right  Result Date: 08/19/2019 CLINICAL DATA:  Wrist pain for 3 days EXAM: RIGHT WRIST - COMPLETE 3+ VIEW COMPARISON:  None. FINDINGS: There is no evidence of fracture or dislocation. There is no evidence of arthropathy or other focal bone abnormality. Soft tissues are unremarkable. IMPRESSION: Negative. Electronically Signed   By: Jerilynn Mages.  Shick M.D.   On: 08/19/2019 12:10    Procedures Procedures (including critical care time)  Medications Ordered in ED Medications  naproxen (NAPROSYN) tablet 500 mg (500 mg Oral Given 08/19/19 1206)    ED Course  I have reviewed the triage vital signs and the nursing notes.  Pertinent labs & imaging results that were available during my care of the patient were reviewed by me and considered in my medical decision making (see chart for details).    MDM Rules/Calculators/A&P                          54 year old female presents with 4 days of right wrist pain without traumatic injury, the wrist is not red hot or swollen she has no associated fevers and is able to range the joint so low suspicion for septic arthritis.  X-rays of the wrist are unremarkable.  The wrist is neurovascularly intact.  Suspect potential sprain versus arthritis.  Will treat with anti-inflammatories and wrist brace and have patient follow-up with sports medicine or PCP if not improving.  Patient expresses  understanding and agreement with this plan.  Discharged home in good condition.  Final Clinical Impression(s) / ED Diagnoses Final diagnoses:  Right wrist pain    Rx / DC Orders ED Discharge Orders         Ordered    naproxen (NAPROSYN) 500 MG tablet  2 times daily     Discontinue  Reprint     08/19/19 [place],  Audery Amel, PA-C 08/19/19 1339    Quintella Reichert, MD 08/19/19 [phone]

## 2019-08-19 NOTE — ED Triage Notes (Signed)
Pt reports right wrist pain that started on Friday. Pt denies injury or prior issues with wrist.

## 2019-08-19 NOTE — Discharge Instructions (Addendum)
Your wrist x-rays look good today, and I do not see signs of infection, this may be arthritis or inflammation of the wrist.  Please take naproxen twice daily, you can take at 1000 mg of Tylenol every 8 hours as well to help with this, use wrist brace to help support wrist.  You can use ice and heat intermittently.  If symptoms or not improving please follow-up with sports medicine or your primary care doctor.  If you develop fevers, swelling redness or warmth over the wrist or any other new or concerning symptoms return to the ED.

## 2019-10-16 DIAGNOSIS — Z419 Encounter for procedure for purposes other than remedying health state, unspecified: Secondary | ICD-10-CM | POA: Diagnosis not present

## 2019-11-15 DIAGNOSIS — Z419 Encounter for procedure for purposes other than remedying health state, unspecified: Secondary | ICD-10-CM | POA: Diagnosis not present

## 2019-12-16 DIAGNOSIS — Z419 Encounter for procedure for purposes other than remedying health state, unspecified: Secondary | ICD-10-CM | POA: Diagnosis not present

## 2019-12-30 ENCOUNTER — Encounter: Payer: Self-pay | Admitting: Family Medicine

## 2019-12-30 ENCOUNTER — Other Ambulatory Visit: Payer: Self-pay

## 2019-12-30 ENCOUNTER — Ambulatory Visit (INDEPENDENT_AMBULATORY_CARE_PROVIDER_SITE_OTHER): Payer: Medicaid Other | Admitting: Family Medicine

## 2019-12-30 VITALS — BP 96/62 | HR 92 | Ht 64.0 in | Wt 140.6 lb

## 2019-12-30 DIAGNOSIS — Z131 Encounter for screening for diabetes mellitus: Secondary | ICD-10-CM

## 2019-12-30 DIAGNOSIS — Z114 Encounter for screening for human immunodeficiency virus [HIV]: Secondary | ICD-10-CM | POA: Diagnosis not present

## 2019-12-30 DIAGNOSIS — Z1211 Encounter for screening for malignant neoplasm of colon: Secondary | ICD-10-CM

## 2019-12-30 DIAGNOSIS — Z13220 Encounter for screening for lipoid disorders: Secondary | ICD-10-CM | POA: Diagnosis not present

## 2019-12-30 DIAGNOSIS — R110 Nausea: Secondary | ICD-10-CM

## 2019-12-30 DIAGNOSIS — Z1231 Encounter for screening mammogram for malignant neoplasm of breast: Secondary | ICD-10-CM | POA: Diagnosis not present

## 2019-12-30 DIAGNOSIS — Z1159 Encounter for screening for other viral diseases: Secondary | ICD-10-CM

## 2019-12-30 DIAGNOSIS — Z23 Encounter for immunization: Secondary | ICD-10-CM | POA: Diagnosis not present

## 2019-12-30 MED ORDER — ONDANSETRON HCL 4 MG PO TABS: 4.0000 mg | ORAL_TABLET | Freq: Three times a day (TID) | ORAL | 0 refills | Status: DC | PRN

## 2019-12-30 NOTE — Progress Notes (Signed)
    SUBJECTIVE:   CHIEF COMPLAINT / HPI: Establish care  Nausea Patient reporting nausea for 2 weeks, with 2 episodes of vomiting over the past 2 weeks. Has not been eating or drinking as much due to nausea. No known triggers. Denies abdominal pain, fever, chills, diarrhea, constipation, headache.  Denies hematemesis. No recent travel.  HCM - needs Hep C screening, amenable - needs HIV screening, amenable - needs mammogram, amenable - needs colonoscopy, amenable - needs pap smear, amenable - needs Tdap, amenable - needs Covid vaccines, does not want today  Social history Reports smoking tobacco socially, 4 to 5 cigarettes at a time when going out, on average twice a month.  Drinks wine socially, 2-1/2 glasses when she is out.  Denies recreational drug use. Lives with her grandson had her sisters place.  She has been struggling to get her own housing since 2018. Declines social work assistance.  PERTINENT  PMH / PSH: Asthma, OA  OBJECTIVE:   BP 96/62   Pulse 92   Ht 5\' 4"  (1.626 m)   Wt 140 lb 9.6 oz (63.8 kg)   SpO2 98%   BMI 24.13 kg/m   General: Middle-aged woman, NAD CV: RRR, no murmurs Pulm: CTAB, no wheezes or rales Abdomen: soft, non-tender, non-distended, +BS  Depression screen [facility] 2/9 12/30/2019  Decreased Interest 0  Down, Depressed, Hopeless 0  PHQ - 2 Score 0  Altered sleeping 3  Tired, decreased energy 2  Change in appetite 1  Feeling bad or failure about yourself  1  Trouble concentrating 3  Moving slowly or fidgety/restless 3  Suicidal thoughts 0  PHQ-9 Score 13    ASSESSMENT/PLAN:   Nausea 2 weeks of nausea with few episodes of vomiting.  Etiology unclear, but differential includes gastroparesis (no known history of diabetes), migraine (no headache), increased ICP (no headache), viral gastroenteritis (no diarrhea). Will defer any imaging for now. - labs: CBC w diff, CMP - Zofran prn - f/u 1-2 weeks  Depression Elevated PHQ-9 score of  13.  Reports feelings of feeling bad about herself due to housing issues, unable to secure housing for herself and her grandson and has been trying since 2018. Denies SI. - therapy resources given  HCM - GI referral ordered for colonoscopy - mammogram ordered - patient to schedule pap smear with myself - Tdap given - screening labs: HIV, Hep C, lipid panel, A1c - Covid vaccine declined, will think about it  Zola Button, MD [place]

## 2019-12-30 NOTE — Patient Instructions (Addendum)
It was nice seeing you today, Jennifer Costa.  Today, we talked about your nausea and health screenings.  For your nausea, I am prescribing the medication called Zofran that you can take every 8 hours as needed.  I will also be doing some blood work to look for any causes of nausea.  I have sent a referral for you to have a colonoscopy.  I have also placed an order for you to have a mammogram at the [facility].  Please call them to set up an appointment. Address: [place], [place], [place] [address] Phone: [phone]  I will update you with your blood work results.  Please make a follow-up appointment in the next 1 to 2 weeks.  You can also schedule your Pap smear with me at your convenience.  Stay well, Jennifer Button, MD    Therapy and Counseling Resources Most providers on this list will take Medicaid. Patients with commercial insurance or Medicare should contact their insurance company to get a list of in network providers.  Best[facility]:Psychiatry and Counseling [address] [facility] [address] [place], [place] [address] [place]  [address], [place], [place] [address]      [place] [address]  [place], [place] [address] [phone]  [place] [address] [place]  [place], [place] [address]       [phone]      Jinny Blossom [facility] [address], [place], [place]  Family Solutions:  [place]. [place] [phone]  [facility] Counseling:  [place] [address] [place] [phone]  [facility] (under & uninsured) [address], [place] [phone]    [email or web address]    [place] [address] . [place]    [phone]  [facility] [place]     Phone:   [phone]     [place]  [place] [address]      [place], [place] [address] [place]: [place], [place], [place]   [place] ([place] therapist) [email or web address]  [place] [address]   [place] [address]    [phone]    [facility]   [address],  [place], [place]   [place]  [place]  [facility]  [address] [place], [place]        [phone]  Open Access/Walk In Clinic under & uninsured  [facility]  [address] [place], [place] [facility] [phone]  [facility] [place],  ([place])   [place], [place]: [phone]) 8:30 - 12; 1 - 2:30  Family Service of the [place],  [place], [place]    ([phone]):8:30 - 12; 2 - 3PM  [facility] [employer],  [address],  [place]; [phone]):   Mon - Fri 8 AM - 5 PM  [facility] [place]  MWF 12:30 to 3:00 or call to schedule an appointment  [phone]  Specific Provider options Psychology Today  [email or web address] 1. click on find  a therapist  2. enter your zip code 3. left side and select or tailor a therapist for your specific need.   [facility] Provider Directory [email or web address]  (Medicaid)   Follow all drop down to find a provider  [place] [phone] or [email or web address] [address], [place], [place] Recovery support and educational   24- Hour Availability:  .  [facility]  . [place], [place] Crisis [phone]  . Family Service of the [employer]  [phone]  [facility] Crisis Service  [phone]   . [place]  [phone] (after hours)  . Therapeutic Alternative/Mobile Crisis   [phone]  . [place] National Suicide Hotline  [phone] ([place])  . Call 911 or go to emergency room  . [employer]  [phone]);  [place] and [employer]   . [facility] ACCESS  [phone]); [place], [place], [place], [place], [place], [place], [place]

## 2019-12-31 LAB — LIPID PANEL

## 2019-12-31 LAB — HIV ANTIBODY (ROUTINE TESTING W REFLEX): HIV Screen 4th Generation wRfx: NONREACTIVE

## 2019-12-31 NOTE — Addendum Note (Signed)
Addended by: Zola Button D on: 12/31/2019 09:20 AM   Modules accepted: Level of Service

## 2020-01-01 ENCOUNTER — Encounter: Payer: Self-pay | Admitting: Family Medicine

## 2020-01-01 LAB — CBC WITH DIFFERENTIAL/PLATELET
Basophils Absolute: 0.1 10*3/uL (ref 0.0–0.2)
Basos: 1 %
EOS (ABSOLUTE): 0.5 10*3/uL — ABNORMAL HIGH (ref 0.0–0.4)
Eos: 5 %
Hematocrit: 43.8 % (ref 34.0–46.6)
Hemoglobin: 15.1 g/dL (ref 11.1–15.9)
Immature Grans (Abs): 0 10*3/uL (ref 0.0–0.1)
Immature Granulocytes: 0 %
Lymphocytes Absolute: 3.7 10*3/uL — ABNORMAL HIGH (ref 0.7–3.1)
Lymphs: 37 %
MCH: 32.5 pg (ref 26.6–33.0)
MCHC: 34.5 g/dL (ref 31.5–35.7)
MCV: 94 fL (ref 79–97)
Monocytes Absolute: 0.6 10*3/uL (ref 0.1–0.9)
Monocytes: 6 %
Neutrophils Absolute: 4.9 10*3/uL (ref 1.4–7.0)
Neutrophils: 51 %
Platelets: 392 10*3/uL (ref 150–450)
RBC: 4.64 x10E6/uL (ref 3.77–5.28)
RDW: 13.3 % (ref 11.7–15.4)
WBC: 9.8 10*3/uL (ref 3.4–10.8)

## 2020-01-01 LAB — COMPREHENSIVE METABOLIC PANEL
ALT: 12 IU/L (ref 0–32)
AST: 17 IU/L (ref 0–40)
Albumin/Globulin Ratio: 1.8 (ref 1.2–2.2)
Albumin: 4.6 g/dL (ref 3.8–4.9)
Alkaline Phosphatase: 75 IU/L (ref 44–121)
BUN/Creatinine Ratio: 11 (ref 9–23)
BUN: 9 mg/dL (ref 6–24)
Bilirubin Total: 0.4 mg/dL (ref 0.0–1.2)
CO2: 20 mmol/L (ref 20–29)
Calcium: 9.8 mg/dL (ref 8.7–10.2)
Chloride: 100 mmol/L (ref 96–106)
Creatinine, Ser: 0.83 mg/dL (ref 0.57–1.00)
GFR calc Af Amer: 92 mL/min/{1.73_m2} (ref >59)
GFR calc non Af Amer: 80 mL/min/{1.73_m2} (ref >59)
Globulin, Total: 2.6 g/dL (ref 1.5–4.5)
Glucose: 83 mg/dL (ref 65–99)
Potassium: 4.1 mmol/L (ref 3.5–5.2)
Sodium: 138 mmol/L (ref 134–144)
Total Protein: 7.2 g/dL (ref 6.0–8.5)

## 2020-01-01 LAB — HEMOGLOBIN A1C
Est. average glucose Bld gHb Est-mCnc: 111 mg/dL
Hgb A1c MFr Bld: 5.5 % (ref 4.8–5.6)

## 2020-01-01 LAB — HCV INTERPRETATION

## 2020-01-01 LAB — LIPID PANEL
Chol/HDL Ratio: 2.6 ratio (ref 0.0–4.4)
Cholesterol, Total: 161 mg/dL (ref 100–199)
LDL Chol Calc (NIH): 84 mg/dL (ref 0–99)
Triglycerides: 70 mg/dL (ref 0–149)
VLDL Cholesterol Cal: 14 mg/dL (ref 5–40)

## 2020-01-01 LAB — HCV AB W REFLEX TO QUANT PCR: HCV Ab: <0.1 s/co ratio (ref 0.0–0.9)

## 2020-01-01 NOTE — Progress Notes (Signed)
Result letter sent to patient, labs unremarkable.

## 2020-01-03 ENCOUNTER — Encounter: Payer: Self-pay | Admitting: Gastroenterology

## 2020-01-15 ENCOUNTER — Encounter: Payer: Self-pay | Admitting: Family Medicine

## 2020-01-15 ENCOUNTER — Other Ambulatory Visit: Payer: Self-pay

## 2020-01-15 ENCOUNTER — Ambulatory Visit (INDEPENDENT_AMBULATORY_CARE_PROVIDER_SITE_OTHER): Payer: Medicaid Other | Admitting: Family Medicine

## 2020-01-15 VITALS — BP 108/60 | HR 92 | Ht 64.0 in | Wt 138.2 lb

## 2020-01-15 DIAGNOSIS — R634 Abnormal weight loss: Secondary | ICD-10-CM

## 2020-01-15 DIAGNOSIS — Z419 Encounter for procedure for purposes other than remedying health state, unspecified: Secondary | ICD-10-CM | POA: Diagnosis not present

## 2020-01-15 DIAGNOSIS — M546 Pain in thoracic spine: Secondary | ICD-10-CM

## 2020-01-15 DIAGNOSIS — R110 Nausea: Secondary | ICD-10-CM | POA: Diagnosis not present

## 2020-01-15 DIAGNOSIS — L84 Corns and callosities: Secondary | ICD-10-CM

## 2020-01-15 HISTORY — DX: Abnormal weight loss: R63.4

## 2020-01-15 NOTE — Progress Notes (Addendum)
    SUBJECTIVE:   CHIEF COMPLAINT / HPI: f/u nausea  Nausea Most recently seen 2 weeks ago for 2 weeks of nausea with 2 reported episodes of vomiting. No symptoms of abdominal pain, fever, chills, diarrhea, constipation, or headache at that time. CMP and CBC w/ diff overall unremarkable except for mildly elevated absolute lymphocytes (3.7) and eosinophils (0.5). Prescribed ondansetron as needed for nausea. Today, still having nausea once every 2-3 days. Still decreased appetite. Occasional abdominal pain, diffuse. Endorses early satiety. Weight loss of 14 pounds over the past several months (was 154 lbs in March this year).  Back pain Pain in mid back, sharp pain to neck occasionally. Started 4 months ago. Pain occurs intermittently, about once a month and resolves within seconds. No meds tried. Denies weakness, incontinence, saddle anesthesia. Both arms numb, started a year ago. Mostly at night, occurs almost every day. About the same on both sides. Denies injuries.  Foot pain Callouses on both feet, bilateral foot pain for years. Hard to wear shoes. Hurts when standing too long. Went to foot doctor in May this year, friend paid cash for that visit. Wants to see podiatrist again, unsure if Medicaid will cover it. Has been cutting callouses herself on occasion.  PERTINENT  PMH / PSH: asthma, OA  OBJECTIVE:   BP 108/60   Pulse 92   Ht _0  (1.626 m)   Wt 138 lb 4 oz (62.7 kg)   SpO2 99%   BMI 23.73 kg/m   General: Middle-aged woman, normal weight, NAD Lymph: no anterior cervical or supraclavicular LAD appreciated CV: RRR, no murmurs Pulm: CTAB, no wheezes or rales Abdomen: soft, non-tender, non-distended MSK: Point tenderness to midline thoracic spine, no C-spine or L-spine tenderness, no paraspinal muscle tenderness, full ROM without pain Derm: multiple callouses/corns noted on bilateral feet, see image below  Right foot   Left foot   ASSESSMENT/PLAN:    Nausea Nausea still ongoing, occasional abdominal pain and what sounds like early satiety. Now 4 weeks of nausea with modest improvement. Also with unintentional weight loss of 14 lbs over the past 9 months. Concerning for malignancy, may need EGD to evaluate for gastric cancer. May also need CT imaging as well, will refer to GI and defer to them for further imaging. - referral to GI   Unintentional weight loss Unintentional weight loss of 14 pounds over the past 9 months, also with 30+ pack-year smoking history and early satiety. Concerning for malignancy, will obtain CXR given smoking history as well as other labs. 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 Previously was seen by podiatry but has not been going recently due to financial reasons. - referral to podiatry   Thoracic back pain Episodic thoracic back pain seldomly occurring and lasting only seconds, started about 4 months ago. Point tenderness to midline thoracic spine. No red flag signs or symptoms. Etiology unclear but concerning for metastatic disease if she indeed does have underlying malignancy.  F/u 4 weeks  Zola Button, MD [place]

## 2020-01-15 NOTE — Assessment & Plan Note (Signed)
Nausea still ongoing, occasional abdominal pain and what sounds like early satiety. Now 4 weeks of nausea with modest improvement. Also with unintentional weight loss of 14 lbs over the past 9 months. Concerning for malignancy, may need EGD to evaluate for gastric cancer. May also need CT imaging as well, will refer to GI and defer to them for further imaging. - referral to GI

## 2020-01-15 NOTE — Assessment & Plan Note (Signed)
Multiple callouses/corns on bilateral feet. Previously was seen by podiatry but has not been going recently due to financial reasons. - referral to podiatry

## 2020-01-15 NOTE — Patient Instructions (Addendum)
It was nice seeing you today, Jennifer Costa.  Today, we talked about nausea, back pain, and foot pain.  For your back pain, I am sending you to get an X-ray of your spine to make sure there is nothing abnormal there.  For your nausea, I am referring you to a GI specialist. They may want to do an upper GI endoscopy (to evaluate your stomach and esophagus with a scope).  Given your weight loss, I am doing further blood work. I also want you to get an X-ray of your chest. I will update you with the results.  I am sending you a referral to a podiatrist/foot doctor.  You can go anytime to [facility] near the clinic for your X-rays. [place], [place], [place] [address] Phone: [phone]  Please return to be seen within the next 4 weeks or sooner if needed.  Stay well, Zola Button, MD

## 2020-01-15 NOTE — Assessment & Plan Note (Addendum)
Unintentional weight loss of 14 pounds over the past 9 months, also with 30+ pack-year smoking history and early satiety. Concerning for malignancy, will obtain CXR given smoking history as well as other labs. - CXR - labs: ESR, TSH w/ reflex 

## 2020-01-16 ENCOUNTER — Telehealth: Payer: Self-pay | Admitting: Family Medicine

## 2020-01-16 LAB — TSH RFX ON ABNORMAL TO FREE T4: TSH: 0.917 u[IU]/mL (ref 0.450–4.500)

## 2020-01-16 LAB — SEDIMENTATION RATE: Sed Rate: 6 mm/hr (ref 0–40)

## 2020-01-16 NOTE — Telephone Encounter (Signed)
Called patient to discuss lab results. Normal TSH and ESR. Informed patient she will need to call [facility] for an appointment as she already has a current referral for colonoscopy.

## 2020-02-12 ENCOUNTER — Other Ambulatory Visit: Payer: Self-pay

## 2020-02-12 ENCOUNTER — Ambulatory Visit (INDEPENDENT_AMBULATORY_CARE_PROVIDER_SITE_OTHER): Payer: Medicaid Other | Admitting: Podiatry

## 2020-02-12 ENCOUNTER — Encounter: Payer: Self-pay | Admitting: Podiatry

## 2020-02-12 DIAGNOSIS — M2041 Other hammer toe(s) (acquired), right foot: Secondary | ICD-10-CM | POA: Diagnosis not present

## 2020-02-12 DIAGNOSIS — M21619 Bunion of unspecified foot: Secondary | ICD-10-CM | POA: Diagnosis not present

## 2020-02-12 DIAGNOSIS — D492 Neoplasm of unspecified behavior of bone, soft tissue, and skin: Secondary | ICD-10-CM | POA: Diagnosis not present

## 2020-02-12 DIAGNOSIS — M2042 Other hammer toe(s) (acquired), left foot: Secondary | ICD-10-CM

## 2020-02-12 NOTE — Progress Notes (Signed)
Subjective:   Patient ID: Jennifer Costa, female   DOB: 54 y.o.   MRN: [id number]   HPI Patient states that she has severe pain in the bottom of her right and left foot and that it also occurs between her toes but it is worse on the bottom of both feet.  States it is gotten worse over the last few months and she does have long-term history of this problem and also has significant structural malalignment of both feet.  Patient smokes half pack per day and is not consistently active   Review of Systems  All other systems reviewed and are negative.       Objective:  Physical Exam Vitals and nursing note reviewed.  Constitutional:      Appearance: She is well-developed and well-nourished.  Cardiovascular:     Pulses: Intact distal pulses.  Pulmonary:     Effort: Pulmonary effort is normal.  Musculoskeletal:        General: Normal range of motion.  Skin:    General: Skin is warm.  Neurological:     Mental Status: She is alert.     Neurovascular status was found to be intact muscle strength found to be within normal limits with range of motion adequate subtalar midtarsal joint.  Significant structural malalignment with elevated digits bilateral with rigid contracture and severe keratotic lesion subfifth metatarsal bilateral that are painful when pressed along with digital deformities and pain between the toes.  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  Sterile sharp debridement of lesions accomplished applied chemical agent to create immune response sterile dressings applied to try to reduce the neoplasm formation and did discuss possibility for surgical intervention in future but I do  not think this would have a good result for her and I would not recommend unless we have no other alternatives

## 2020-02-15 DIAGNOSIS — Z419 Encounter for procedure for purposes other than remedying health state, unspecified: Secondary | ICD-10-CM | POA: Diagnosis not present

## 2020-02-25 ENCOUNTER — Telehealth: Payer: Self-pay | Admitting: *Deleted

## 2020-02-25 NOTE — Telephone Encounter (Signed)
Missed pre-visit, message left for patient to call and reschedule to avoid cancellation of up-coming colonoscopy.

## 2020-02-26 NOTE — Telephone Encounter (Signed)
No return phone call to reschedule previsit. Colonoscopy cancelled and missed appointment letter sent.

## 2020-02-27 ENCOUNTER — Ambulatory Visit: Payer: Medicaid Other | Admitting: Family Medicine

## 2020-02-28 ENCOUNTER — Encounter: Payer: Self-pay | Admitting: *Deleted

## 2020-02-28 NOTE — Telephone Encounter (Signed)
Encounter opened in error

## 2020-03-04 ENCOUNTER — Encounter: Payer: Medicaid Other | Admitting: Gastroenterology

## 2020-03-17 ENCOUNTER — Encounter: Payer: Self-pay | Admitting: Family Medicine

## 2020-03-17 ENCOUNTER — Other Ambulatory Visit: Payer: Self-pay

## 2020-03-17 ENCOUNTER — Ambulatory Visit (INDEPENDENT_AMBULATORY_CARE_PROVIDER_SITE_OTHER): Payer: Medicaid Other | Admitting: Family Medicine

## 2020-03-17 VITALS — BP 106/64 | HR 79 | Ht 64.0 in | Wt 138.6 lb

## 2020-03-17 DIAGNOSIS — Z59819 Housing instability, housed unspecified: Secondary | ICD-10-CM

## 2020-03-17 DIAGNOSIS — R634 Abnormal weight loss: Secondary | ICD-10-CM | POA: Diagnosis not present

## 2020-03-17 DIAGNOSIS — Z599 Problem related to housing and economic circumstances, unspecified: Secondary | ICD-10-CM

## 2020-03-17 DIAGNOSIS — Z419 Encounter for procedure for purposes other than remedying health state, unspecified: Secondary | ICD-10-CM | POA: Diagnosis not present

## 2020-03-17 NOTE — Assessment & Plan Note (Signed)
Unintentional weight loss of approximately 14 pounds over the past several months with smoking history and early satiety. Concern for underlying malignancy. Could also be related to decreased appetite from underlying mood disorder. At last visit, referral to GI was placed and imaging (CXR, XR thoracic spine) was ordered but these have not yet been done. - patient to reschedule GI appt - CXR, XR thoracic spine (directions given)

## 2020-03-17 NOTE — Progress Notes (Signed)
    SUBJECTIVE:   CHIEF COMPLAINT / HPI:   Nausea At most recent visit 2 months ago, patient was seen for intermittent nausea which had been going on for 1 month.  Also with symptoms of decreased appetite, early satiety, and weight loss (14 pounds over the past several months).  Symptoms concerning for possible malignancy, so she was referred to GI and CXR and XR thoracic spine were ordered.  It appears she no showed for GI appointment last month and has not yet gotten her CXR. Needs to reschedule GI, something had come up. Nausea is resolved now. Still having decreased appetite, early satiety. Going on for months. Eats once a day, used to eat twice a day. Back pain resolved now. Denies dysphagia, SOB.   Still living at sister's place. Interested in getting social work assistance with housing. Still feeling depressed regarding financial situation. Denies SI.  PERTINENT  PMH / PSH: asthma, OA  OBJECTIVE:   BP 106/64   Pulse 79   Ht 5\' 4"  (1.626 m)   Wt 138 lb 9.6 oz (62.9 kg)   SpO2 99%   BMI 23.79 kg/m   General: Alert, NAD CV: RRR, no murmurs Pulm: CTAB, no wheezes or rales Abd: soft, non-tender    Depression screen [facility] 2/9 03/17/2020  Decreased Interest 3  Down, Depressed, Hopeless 3  PHQ - 2 Score 6  Altered sleeping 1  Tired, decreased energy 0  Change in appetite 1  Feeling bad or failure about yourself  3  Trouble concentrating 0  Moving slowly or fidgety/restless 0  Suicidal thoughts 0  PHQ-9 Score 11  Difficult doing work/chores Very difficult    ASSESSMENT/PLAN:   Unintentional weight loss Unintentional weight loss of approximately 14 pounds over the past several months with smoking history and early satiety. Concern for underlying malignancy. Could also be related to decreased appetite from underlying mood disorder. At last visit, referral to GI was placed and imaging (CXR, XR thoracic spine) was ordered but these have not yet been done. - patient to  reschedule GI appt - CXR, XR thoracic spine (directions given)   Depression Related to housing insecurity and financial situation. - therapy resources given - consider medical treatment at f/u  Housing instability Discussed CCM services, patient willing to receive services to assist with housing and financial situation. - CCM referral  Zola Button, MD [place]

## 2020-03-17 NOTE — Patient Instructions (Addendum)
It was nice seeing you today!  I am glad you are feeling a little bit better.  Please reschedule your GI appointment when you can.  Attached is a map of [facility]. Please go there to get your X-rays and mammogram. You will need to call to make an appointment for the mammogram.  I have sent in a referral for our social worker.  You can expect a call from her probably in the next week or two.  Below are some options for therapy.  I think it be a good idea to get in with a therapist.  Stay well, Zola Button, MD [place] [phone]    Therapy and Counseling Resources Most providers on this list will take Medicaid. Patients with commercial insurance or Medicare should contact their insurance company to get a list of in network providers.  BestDay:Psychiatry and Counseling [address] [facility] [place]. [place], [place] [address] [place]  [address], [place], [place] [address]      [place] [address]  [place], [place] [address] [phone]  [place] [address] [place]  [place], [place] [address]       [phone]      Jinny Blossom [facility] [address] [place], [place]  Family Solutions:  [place]. [place] [phone]  [facility]:  [place] [address] [phone]  [facility] (under & uninsured) [address], [place] [phone]    [email or web address]    [place] [address] . [place]    [phone]  [facility] [place]     Phone:  [phone]     [place]  [place] [address]      [place], [place] ext Bureau: [place],  [place], [place]   [place] ([place] therapist) [email or web address]  [place] [address]   [place] [address]    [phone]    [facility]   [address],  [place], [place]   [place]  [place]  [facility]  [address] [place], [place]        [phone]  Open Access/Walk In Clinic under & uninsured  [facility]  [address] [place], [place] Crisis [phone]  [facility] [place],  ([place])   [place], [place]: [phone]) 8:30 - 12; 1 - 2:30  [facility] of the [place],  [place], [place]    [phone]):8:30 - 12; 2 - 3PM  [facility] [employer],  [address]  [place]; [phone]):   Mon - Fri 8 AM - 5 PM  [facility] [place]  MWF 12:30 to 3:00 or call to schedule an appointment  [phone]  Specific Provider options Psychology Today  [email or web address].psychologytoday.com/us 1. click on find a therapist  2. enter your zip code 3. left side and select or tailor a therapist for your specific need.   [facility] Provider Directory [email or web address]  (Medicaid)   Follow  all drop down to find a provider  [place] [phone] or [email or web address] [address], [place], [place] Recovery support and educational   24- Hour Availability:  .  Marland Kitchen [facility]  . [place], [place] Crisis [phone]  . Family Service of the [employer] [phone]  [facility] Crisis Service  [phone]   . [place]  [phone] (after hours)  . Therapeutic Alternative/Mobile Crisis   7377294158  . [place] National  Suicide Hotline  762-559-0046 ([place])  . Call 911 or go to emergency room  . [employer]  [phone]);  [place] and [employer]   . [facility] ACCESS  [phone]); [place], [place], [place], [place], [place], [place], [place]

## 2020-03-19 ENCOUNTER — Telehealth: Payer: Self-pay | Admitting: Family Medicine

## 2020-03-19 NOTE — Telephone Encounter (Signed)
° °  Telephone encounter was:  Unsuccessful.  03/19/2020 Name: Kaitrin Seybold MRN: [id number] DOB: 1965-12-07  Unsuccessful outbound call made today to assist with:  Financial Difficulties related to bills.   Outreach Attempt:  1st Attempt  A HIPAA compliant voice message was left requesting a return call.  Instructed patient to call back at [phone].  [place], Care Management Phone: [phone] Email: [email or web address]

## 2020-03-26 ENCOUNTER — Telehealth: Payer: Self-pay | Admitting: Family Medicine

## 2020-03-26 NOTE — Telephone Encounter (Signed)
   Telephone encounter was:  Successful.  03/26/2020 Name: Jennifer Costa MRN: [id number] DOB: [birth year]  Jennifer Costa is a 55 y.o. year old female who is a primary care patient of Zola Button, MD . The community resource team was consulted for assistance with Financial Difficulties related to bills and housing  Care guide performed the following interventions: Patient provided with information about care guide support team and interviewed to confirm resource needs Investigation of community resources performed Discussed resources to assist with housing resources and bill payment. After speaking with this patient I found out that she has no income and lives on her sister's couch with her grandson that she has custody of. This is outside of CG scope and will need to be handled by a licensed SW. Called and spoke to [facility] and she will schedule with the LCSW of this practice.  .  Follow Up Plan:  This patient will not be closed and instead be scheduled for an LCSW to follow patient.   [place], Care Management Phone: [phone] Email: [email or web address]

## 2020-03-27 ENCOUNTER — Telehealth: Payer: Self-pay | Admitting: *Deleted

## 2020-03-27 NOTE — Chronic Care Management (AMB) (Signed)
  Care Management   Note  03/27/2020 Name: Hanley Rispoli MRN: [id number] DOB: 11-30-1965  Sparkles Mcneely is a 55 y.o. year old female who is a primary care patient of Zola Button, MD. I reached out to Larae Grooms by phone today in response to a referral sent by Ms. Shaune Leeks PCP, Zola Button, MD.  Ms. Ranganathan was given information about care management services today including:  1. Care management services include personalized support from designated clinical staff supervised by her physician, including individualized plan of care and coordination with other care providers 2. 24/7 contact phone numbers for assistance for urgent and routine care needs. 3. The patient may stop care management services at any time by phone call to the office staff.  Patient agreed to services and verbal consent obtained.   Follow up plan: Telephone appointment with care management team member scheduled for: 04/02/2020  [place] Management

## 2020-04-02 ENCOUNTER — Ambulatory Visit: Payer: Medicaid Other | Admitting: Licensed Clinical Social Worker

## 2020-04-02 ENCOUNTER — Telehealth: Payer: Self-pay | Admitting: Licensed Clinical Social Worker

## 2020-04-02 DIAGNOSIS — Z789 Other specified health status: Secondary | ICD-10-CM

## 2020-04-02 DIAGNOSIS — Z59819 Housing instability, housed unspecified: Secondary | ICD-10-CM

## 2020-04-02 DIAGNOSIS — Z599 Problem related to housing and economic circumstances, unspecified: Secondary | ICD-10-CM

## 2020-04-02 DIAGNOSIS — Z139 Encounter for screening, unspecified: Secondary | ICD-10-CM

## 2020-04-02 NOTE — Chronic Care Management (AMB) (Signed)
Care Management Clinical Social Work Note  04/02/2020 Name: Jennifer Costa MRN: [id number] DOB: 1965-06-29  Jennifer Costa is a 55 y.o. year old female who is a primary care patient of Zola Button, MD.  The Care Management team was consulted for assistance with coordination needs.  Engaged with patient by telephone for initial visit in response to provider referral for social work care coordination services for chronic homelessness.   Consent to Services:  Jennifer Costa was given information about Care Management services today including:  1. Care Management services includes personalized support from designated clinical staff supervised by her physician, including individualized plan of care and coordination with other care providers 2. 24/7 contact phone numbers for assistance for urgent and routine care needs. 3. The patient may stop case management services at any time by phone call to the office staff. Patient agreed to services and consent obtained.   Assessment: Patient is currently experiencing symptoms of  stress which seems to be exacerbated by her housing situation of being homeless for the past 4 years. She denies wanting to connect for counseling services states she only needs housing resources. See Care Plan below for interventions and patient self-care actives. Recent life changes Jennifer Costa: Patient and 61 year old grandson living with sister for 21/2 years. Sleeping on a pullout with no space of their own.  Recently started working at [employer] and has income. Recommendation: Patient may benefit from, and is in agreement to referral to [employer] .  Follow up Plan: Patient does not require or desire continued follow-up. Will contact the office if needed.  LCSW will outreach in 45 days and disconnect from care team if no needs in 90 days.      Review of patient past medical history, allergies, medications, and health status, including review of relevant consultants reports  was performed today as part of a comprehensive evaluation and provision of chronic care management and care coordination services.  SDOH (Social Determinants of Health) assessments and interventions performed:  SDOH Interventions   Flowsheet Row Most Recent Value  SDOH Interventions   Financial Strain Interventions [id number] Referral  Housing Interventions [id number] Referral  Stress Interventions Provide Counseling       Advanced Directives Status: Not addressed in this encounter.  Care Plan  No Known Allergies  Outpatient Encounter Medications as of 04/02/2020  Medication Sig  . magnesium hydroxide (MILK OF MAGNESIA) 400 MG/5ML suspension Take 15 mLs by mouth daily as needed for constipation.  . ondansetron (ZOFRAN) 4 MG tablet Take 1 tablet (4 mg total) by mouth every 8 (eight) hours as needed for nausea or vomiting.   No facility-administered encounter medications on file as of 04/02/2020.    Patient Active Problem List   Diagnosis Date Noted  . Unintentional weight loss 01/15/2020  . Nausea 01/15/2020  . Pre-ulcerative corn or callous 01/15/2020    Conditions to be addressed/monitored: Depression and Homelessness; Housing barriers  Care Plan : Clinical Social Work  Updates made by Maurine Cane, LCSW since 04/02/2020 12:00 AM  Problem: Chronic homeless /needs affordable housing   Long-Range Goal: Barriers to Treatment/ obtain affordable housing   Start Date: 04/02/2020  This Visit's Progress: On track  Priority: High  Current barriers:   . Patient in need of assistance with connecting to community resources for housing resources . Acknowledges deficits with meeting this unmet need . Patient is unable to independently navigate community resource options without care coordination support Clinical Goals: Over the next 30 days,  patient will work with [employer] to address needs related to housing Clinical Interventions:  . Collaboration with Zola Button, MD regarding development and update of comprehensive plan of care as evidenced by provider attestation and co-signature . Inter-disciplinary care team collaboration (see longitudinal plan of care) . Assessment of needs, barriers , agencies contacted, as well as how impacting  . Review various resources, discussed options and provided patient information about [employer], [place] Management, [employer], [facility]  . Referral placed via [place] to [employer],  . Other interventions provided:Motivational Interviewing, Solution-Focused Strategies, Emotional/Supportive Counseling, and Problem Solving Patient Goals/Self-Care Activities: Over the next 30 days . I have place a referral to [facility]  [phone] if no one has called you in a week . Please call the other agencies to see if they are able to assist: [facility] Housing Management [phone], [place]in [phone], [place]      [name], [place] / [place]   [phone] 4:31 PM

## 2020-04-02 NOTE — Patient Instructions (Addendum)
Visit Information  Jennifer Costa was given information about Medicaid Managed Care team care coordination services as a part of their [facility] Medicaid benefit. Jennifer Costa verbally consented to engagement with the [facility] Managed Care team.   For questions related to your [facility] health plan, please call: [phone]  If you would like to schedule transportation through your [facility] plan, please call the following number at least 2 days in advance of your appointment: [phone]  Jennifer Costa - following are the goals we discussed in your visit today:  Goals Addressed            This Visit's Progress   . Find Help in My Community       Timeframe:  Long-Range Goal Priority:  High Start Date:    04/02/2020                         Expected End Date:   ongoing                     Patient Goals/Self-Care Activities: Over the next 30 days . I have place a referral to [facility]  [phone] if no one has called you in a week . Please call the other agencies to see if they are able to assist: [place] [phone], [place] Sabin [phone], [facility] [phone]   Why is this important?    Knowing how and where to find help for yourself or family in your neighborhood and community is an important skill.       Patient verbalizes understanding of instructions provided today.   Licensed Clinical Social Worker will f/u with patient in 26 days3/31/22  Jennifer Costa Jennifer Leonor Darnell, LCSW  Following is a copy of your plan of care:  Patient Care Plan: Clinical Social Work  Problem Identified: Chronic homeless /needs affordable housing   Long-Range Goal: Barriers to Treatment/ obtain affordable housing   Start Date: 04/02/2020  This Visit's Progress: On track  Priority: High  Current barriers:   . Patient in need of assistance with connecting to community resources for housing resources . Acknowledges deficits with meeting this unmet  need . Patient is unable to independently navigate community resource options without care coordination support Clinical Goals: Over the next 30 days, patient will work with [employer] to address needs related to housing Clinical Interventions:  . Collaboration with Jennifer Button, MD regarding development and update of comprehensive plan of care as evidenced by provider attestation and co-signature . Inter-disciplinary care team collaboration (see longitudinal plan of care) . Assessment of needs, barriers , agencies contacted, as well as how impacting  . Review various resources, discussed options and provided patient information about [employer], [place] Management, [employer], [facility]  . Referral placed via [place] to [employer],  . Other interventions provided:Motivational Interviewing, Solution-Focused Strategies, Emotional/Supportive Counseling, and Problem Solving Patient Goals/Self-Care Activities: Over the next 30 days . I have place a referral to [facility]  [phone] if no one has called you in a week . Please call the other agencies to see if they are able to assist: [facility] Housing Management [phone], [facility] [phone], [facility] [phone]

## 2020-04-02 NOTE — Chronic Care Management (AMB) (Signed)
    Clinical Social Work  Care Management  Unsuccessful Phone Outreach    04/02/2020 Name: Ariani Seier MRN: [id number] DOB: 11-Oct-1965  Lakota Schweppe is a 55 y.o. year old female who is a primary care patient of Zola Button, MD .   LCSW reached out to patient today by phone to introduce self, assess needs and offer Care Management services and interventions.   Telephone outreach was unsuccessful A HIPPA compliant phone message was left for the patient providing contact information and requesting a return call.   Plan:A HIPPA compliant phone message was left for the patient providing contact information and requesting a return call.  LCSW will wait for return call.  Review of patient status, including review of consultants reports, relevant laboratory and other test results, and collaboration with appropriate care team members and the patient's provider was performed as part of comprehensive patient evaluation and provision of care management services.    [name], [place] / [place]   [phone] 3:09 PM

## 2020-04-14 DIAGNOSIS — Z419 Encounter for procedure for purposes other than remedying health state, unspecified: Secondary | ICD-10-CM | POA: Diagnosis not present

## 2020-04-15 ENCOUNTER — Telehealth: Payer: Self-pay | Admitting: *Deleted

## 2020-04-15 NOTE — Telephone Encounter (Signed)
Patient left message on referral asking to speak with the social worker. States she was calling her back for more information.  Pt outreach was 03/2020.  Will forward to [employer], lcsw. Kayvon Mo,CMA

## 2020-04-16 ENCOUNTER — Ambulatory Visit: Payer: Medicaid Other | Admitting: Licensed Clinical Social Worker

## 2020-04-16 DIAGNOSIS — Z789 Other specified health status: Secondary | ICD-10-CM

## 2020-04-16 DIAGNOSIS — Z59819 Housing instability, housed unspecified: Secondary | ICD-10-CM

## 2020-04-16 DIAGNOSIS — Z599 Problem related to housing and economic circumstances, unspecified: Secondary | ICD-10-CM

## 2020-04-16 NOTE — Patient Instructions (Signed)
  Jennifer Costa  it was nice speaking with you. Please call me directly [phone] if you have questions about the goals we discussed. Goals Addressed            This Visit's Progress   . Find Help in My Community   Not on track    Timeframe:  Long-Range Goal Priority:  High Start Date:    04/02/2020                         Expected End Date:   ongoing                      Patient Goals/Self-Care Activities: Over the next 30 days . I have placed a 2nd referral to [facility]  [phone] if no one has called you in a week . Please call the other agencies to see if they are able to assist: [place] [phone], Burnetta Sabin [phone], [facility] [phone]   Why is this important?    Knowing how and where to find help for yourself or family in your neighborhood and community is an important skill.      Jennifer Costa received Care Coordination services today:  1. Care Coordination services include personalized support from designated clinical staff supervised by her physician, including individualized plan of care and coordination with other care providers 2. 24/7 contact [phone] for assistance for urgent and routine care needs. 3. Care Coordination are voluntary services and be declined at any time by calling the office.  Patient verbalizes understanding of instructions provided today.    Follow up plan: Appointment scheduled for SW follow up with client by phone on: 05/14/20  Maurine Cane, LCSW

## 2020-04-16 NOTE — Chronic Care Management (AMB) (Signed)
Care Management Clinical Social Work Note  04/16/2020 Name: Jennifer Costa MRN: [id number] DOB: Oct 14, 1965  Jennifer Costa is a 55 y.o. year old female who is a primary care patient of Zola Button, MD.  The Care Management team was consulted for assistance with chronic disease management and coordination needs.  Engaged with patient by telephone for follow up visit in response to provider referral for social work chronic care management and care coordination services  Consent to Services:  Patient agreed to services and consent obtained.   Assessment: Patient continues to experience difficulty with locating housing.  She made connection with [employer] but experienced barriers with getting the housing information they e-mailed her. Other housing resources e-mailed to patient.  See Care Plan below for interventions and patient self-care actives.  Recommendation: Patient may benefit from, and is in agreement to allow LCSW to place a new referral. She will also contact [employer] by phone.   Follow up Plan: Patient would like continued follow-up.  CCM LCSW will f/u with patient in 4  weeks. Patient will call office if needed prior to next encounter   Review of patient past medical history, allergies, medications, and health status, including review of relevant consultants reports was performed today as part of a comprehensive evaluation and provision of chronic care management and care coordination services.  SDOH (Social Determinants of Health) assessments and interventions performed:    Advanced Directives Status: Not addressed in this encounter.  Care Plan  No Known Allergies  Outpatient Encounter Medications as of 04/16/2020  Medication Sig  . magnesium hydroxide (MILK OF MAGNESIA) 400 MG/5ML suspension Take 15 mLs by mouth daily as needed for constipation.  . ondansetron (ZOFRAN) 4 MG tablet Take 1 tablet (4 mg total) by mouth every 8 (eight) hours as needed  for nausea or vomiting.   No facility-administered encounter medications on file as of 04/16/2020.    Patient Active Problem List   Diagnosis Date Noted  . Unintentional weight loss 01/15/2020  . Nausea 01/15/2020  . Pre-ulcerative corn or callous 01/15/2020    Conditions to be addressed/monitored: Homelessness; Housing barriers  Care Plan : Clinical Social Work  Updates made by Maurine Cane, LCSW since 04/16/2020 12:00 AM  Problem: Chronic homeless /needs affordable housing   Long-Range Goal: Barriers to Treatment/ obtain affordable housing   Start Date: 04/02/2020  Recent Progress: On track  Priority: High  Current barriers:   . Received call from [employer] but they sent information to wrong e-mail . Patient in need of assistance with connecting to community resources for housing resources . Acknowledges deficits with meeting this unmet need . Patient is unable to independently navigate community resource options without care coordination support Clinical Goals: Over the next 30 days, patient will work with [employer] to address needs related to housing Clinical Interventions:  . Collaboration with Zola Button, MD regarding development and update of comprehensive plan of care as evidenced by provider attestation and co-signature . Inter-disciplinary care team collaboration (see longitudinal plan of care) . Assessment of needs, barriers , agencies contacted, as well as how impacting  . Provided patient information about [employer], [place] Management, [employer], [facility]( sent copy to her e-mail as she requested . Placed 2nd referral via [place] to [employer] as they sent information to wrong address  . Other interventions provided:Motivational Interviewing, Solution-Focused Strategies, Emotional/Supportive Counseling, and Problem Solving Patient Goals/Self-Care Activities: Over the next 30  days .  I have placed another referral to [place] call if no one has called you in a week . Please call the other agencies to see if they are able to assist: [facility] Housing Management [phone], Burnetta Sabin [phone], [place]    [name], [place] / [place]   [phone] 3:51 PM

## 2020-05-14 ENCOUNTER — Ambulatory Visit: Payer: Medicaid Other | Admitting: Licensed Clinical Social Worker

## 2020-05-14 DIAGNOSIS — Z789 Other specified health status: Secondary | ICD-10-CM

## 2020-05-14 NOTE — Patient Instructions (Signed)
  Ms. Petrides  it was nice speaking with you. Please call me directly if you have questions about the goals we discussed. Goals Addressed            This Visit's Progress   . Find Help in My Community   On track    Timeframe:  Long-Range Goal Priority:  High Start Date:    04/02/2020                         Expected End Date:   ongoing                      Patient Goals/Self-Care Activities:  .  Continue working [employer]  [phone] for housing options  . Please call the other agencies to see if they are able to assist: [facility] Housing Management [phone], [facility] [phone], [facility] [phone] .  Why is this important?    Knowing how and where to find help for yourself or family in your neighborhood and community is an important skill.        Ms. Fraiser received Care Coordination services today:  1. Care Coordination services include personalized support from designated clinical staff supervised by her physician, including individualized plan of care and coordination with other care providers 2. 24/7 contact [phone] for assistance for urgent and routine care needs. 3. Care Coordination are voluntary services and be declined at any time by calling the office.  Patient verbalizes understanding of instructions provided today.    Follow up plan: no follow up scheduled. Patient will call office if needed.  Maurine Cane, [place]

## 2020-05-14 NOTE — Chronic Care Management (AMB) (Signed)
Care Management Clinical Social Work Note  05/14/2020 Name: Jennifer Costa MRN: [id number] DOB: [birth year]  Jennifer Costa is a 55 y.o. year old female who is a primary care patient of Jennifer Button, MD.  The Care Management team was consulted for assistance with coordination needs.  Engaged with patient by telephone for follow up visit in response to provider referral for social work  care coordination for stress related to housing.  Consent to Services:Patient agreed to services and consent obtained.   Assessment: Patient is making progress with managing her stress.  She has connected with [employer] for housing information and support. See Care Plan below for interventions and patient self-care actives. Recent life changes Jennifer Costa: Just started a new job Recommendation: Patient may benefit from, and is in agreement to explore all housing option.   Follow up Plan: Patient does not require or desire continued follow-up. Will contact the office if needed.  LCSW will disconnect from care team if no additional needs in the next 60 days.   Review of patient past medical history, allergies, medications, and health status, including review of relevant consultants reports was performed today as part of a comprehensive evaluation and provision of chronic care management and care coordination services.  SDOH (Social Determinants of Health) assessments and interventions performed:    Advanced Directives Status: Not ready or willing to discuss.  Care Plan  No Known Allergies  Outpatient Encounter Medications as of 05/14/2020  Medication Sig  . magnesium hydroxide (MILK OF MAGNESIA) 400 MG/5ML suspension Take 15 mLs by mouth daily as needed for constipation.  . ondansetron (ZOFRAN) 4 MG tablet Take 1 tablet (4 mg total) by mouth every 8 (eight) hours as needed for nausea or vomiting.   No facility-administered encounter medications on file as of 05/14/2020.    Patient Active Problem  List   Diagnosis Date Noted  . Unintentional weight loss 01/15/2020  . Nausea 01/15/2020  . Pre-ulcerative corn or callous 01/15/2020    Conditions to be addressed/monitored: stress; Housing barriers  Care Plan : Clinical Social Work  Updates made by Jennifer Cane, LCSW since 05/14/2020 12:00 AM  Problem: Chronic homeless /needs affordable housing   Long-Range Goal: Barriers to Treatment/ obtain affordable housing   Start Date: 04/02/2020  This Visit's Progress: On track  Recent Progress: On track  Priority: High  Current barriers:   . Patient in need of assistance with connecting to community resources for housing resources . Acknowledges deficits with meeting this unmet need . Patient is unable to independently navigate community resource options without care coordination support Clinical Goals: patient will continue to work with [employer] to address needs related to housing Clinical Interventions:  . Collaboration with Jennifer Button, MD regarding development and update of comprehensive plan of care as evidenced by provider attestation and co-signature . Inter-disciplinary care team collaboration (see longitudinal plan of care) . Assessment of needs, barriers , agencies contacted and progress with housing . Patient has connected with [employer] and they are assisting her with housing  . Other interventions provided:Motivational Interviewing, Solution-Focused Strategies, Emotional/Supportive Counseling, and Problem Solving Patient Goals/Self-Care Activities: Over the next 30 days . Continue working [employer]  [phone] for housing options  . Please call the other agencies to see if they are able to assist: [facility] [phone], Jennifer Costa [phone], [place]      Jennifer Costa, [place] / [place]  [phone] 3:29 PM

## 2020-05-15 DIAGNOSIS — Z419 Encounter for procedure for purposes other than remedying health state, unspecified: Secondary | ICD-10-CM | POA: Diagnosis not present

## 2020-06-14 DIAGNOSIS — Z419 Encounter for procedure for purposes other than remedying health state, unspecified: Secondary | ICD-10-CM | POA: Diagnosis not present

## 2020-07-15 DIAGNOSIS — Z419 Encounter for procedure for purposes other than remedying health state, unspecified: Secondary | ICD-10-CM | POA: Diagnosis not present

## 2020-08-14 DIAGNOSIS — Z419 Encounter for procedure for purposes other than remedying health state, unspecified: Secondary | ICD-10-CM | POA: Diagnosis not present

## 2020-09-14 DIAGNOSIS — Z419 Encounter for procedure for purposes other than remedying health state, unspecified: Secondary | ICD-10-CM | POA: Diagnosis not present

## 2020-10-15 DIAGNOSIS — Z419 Encounter for procedure for purposes other than remedying health state, unspecified: Secondary | ICD-10-CM | POA: Diagnosis not present

## 2020-11-14 DIAGNOSIS — Z419 Encounter for procedure for purposes other than remedying health state, unspecified: Secondary | ICD-10-CM | POA: Diagnosis not present

## 2020-12-15 DIAGNOSIS — Z419 Encounter for procedure for purposes other than remedying health state, unspecified: Secondary | ICD-10-CM | POA: Diagnosis not present

## 2020-12-18 ENCOUNTER — Ambulatory Visit (INDEPENDENT_AMBULATORY_CARE_PROVIDER_SITE_OTHER): Payer: Medicaid Other

## 2020-12-18 ENCOUNTER — Other Ambulatory Visit: Payer: Self-pay

## 2020-12-18 DIAGNOSIS — Z0000 Encounter for general adult medical examination without abnormal findings: Secondary | ICD-10-CM

## 2020-12-18 NOTE — Progress Notes (Signed)
Patient presents to nurse clinic for work required documentation of Hepatitis B vaccine. Per [facility] and Epic, unable to find where patient received vaccination. Spoke with preceptor, Dr. McDiarmid and received verbal orders for Hep B titer to check immunologic status.   Sent patient link to activate mychart so she is able to view results once they come back.   Talbot Grumbling, RN

## 2020-12-19 LAB — HEPATITIS B SURFACE ANTIBODY, QUANTITATIVE: Hepatitis B Surf Ab Quant: 3.1 m[IU]/mL — ABNORMAL LOW (ref >9.9)

## 2020-12-25 ENCOUNTER — Ambulatory Visit: Payer: Medicaid Other

## 2021-01-01 ENCOUNTER — Other Ambulatory Visit: Payer: Self-pay

## 2021-01-01 ENCOUNTER — Ambulatory Visit (INDEPENDENT_AMBULATORY_CARE_PROVIDER_SITE_OTHER): Payer: Medicaid Other

## 2021-01-01 ENCOUNTER — Telehealth: Payer: Self-pay

## 2021-01-01 DIAGNOSIS — Z23 Encounter for immunization: Secondary | ICD-10-CM

## 2021-01-01 NOTE — Telephone Encounter (Signed)
Zola Button, MD  Newell Wafer, Betti Cruz, RN She just needs one hep B vaccine but she will need her immunity tested again 1-2 months after getting the vaccine. Thanks!

## 2021-01-01 NOTE — Progress Notes (Signed)
Patient presents to nurse clinic for Hepatitis B vaccination. See below message from Dr. Nancy Fetter.   Zola Button, MD  [place] Vanaken, Betti Cruz, RN She just needs one hep B vaccine but she will need her immunity tested again 1-2 months after getting the vaccine. Thanks!   Administered in RD, site unremarkable, tolerated injection well. Patient will call back in January to schedule for follow up immunity testing.   Talbot Grumbling, RN

## 2021-01-11 ENCOUNTER — Encounter: Payer: Self-pay | Admitting: Family Medicine

## 2021-01-11 ENCOUNTER — Other Ambulatory Visit: Payer: Self-pay

## 2021-01-11 ENCOUNTER — Ambulatory Visit (INDEPENDENT_AMBULATORY_CARE_PROVIDER_SITE_OTHER): Payer: Medicaid Other | Admitting: Family Medicine

## 2021-01-11 DIAGNOSIS — G5601 Carpal tunnel syndrome, right upper limb: Secondary | ICD-10-CM | POA: Insufficient documentation

## 2021-01-11 DIAGNOSIS — F32A Depression, unspecified: Secondary | ICD-10-CM

## 2021-01-11 MED ORDER — NAPROXEN 500 MG PO TABS: 500.0000 mg | ORAL_TABLET | Freq: Two times a day (BID) | ORAL | 0 refills | Status: DC | PRN

## 2021-01-11 NOTE — Assessment & Plan Note (Signed)
Stress in the setting of financial difficulty.  Therapy resources given once again.  Not interested in medication at this time.

## 2021-01-11 NOTE — Progress Notes (Signed)
    SUBJECTIVE:   CHIEF COMPLAINT / HPI: inflammation in hand  Right wrist pain Patient reports intermittent right wrist pain with tingling sensation for the past 2 weeks.  She will sometimes have to shake her hands which improves the pain.  No injuries.  Her work involves typing.  No medications tried.  Upper back pain Patient reports intermittent pain in her upper back/neck on the right side for the past few months.  She will notice that sometimes when turning her head a certain way.  No pain currently.  Denies weakness.  Recently started a new job, Land at [employer].  She has applied for housing and still waiting to hear back.  PERTINENT  PMH / PSH: Noncontributory  OBJECTIVE:   BP 95/85   Pulse 84   Ht 5\' 4"  (1.626 m)   Wt 148 lb 6.4 oz (67.3 kg)   SpO2 100%   BMI 25.47 kg/m   General: Alert, NAD MSK: Cervical spine with no obvious deformities.  Full range of motion without pain.  No spinal or paraspinal muscle tenderness.  Spurling's negative.  No pain with resisted wrist flexion or extension.  Positive Phalen's. Neuro: Full strength in her upper extremities. Derm: Scattered corns noted on her bilateral hands.  Wt Readings from Last 3 Encounters:  01/11/21 148 lb 6.4 oz (67.3 kg)  03/17/20 138 lb 9.6 oz (62.9 kg)  01/15/20 138 lb 4 oz (62.7 kg)    Depression screen PHQ 2/9 01/11/2021  Decreased Interest 1  Down, Depressed, Hopeless 3  PHQ - 2 Score 4  Altered sleeping 2  Tired, decreased energy 3  Change in appetite 2  Feeling bad or failure about yourself  1  Trouble concentrating 0  Moving slowly or fidgety/restless 0  Suicidal thoughts 0  PHQ-9 Score 12  Difficult doing work/chores Not difficult at all     ASSESSMENT/PLAN:   Depression Stress in the setting of financial difficulty.  Therapy resources given once again.  Not interested in medication at this time.  Carpal tunnel syndrome of right wrist Right wrist pain with positive flick test  likely secondary to carpal tunnel.  We will trial wrist splint at night and NSAIDs as needed.   Neck pain Suspect MSK etiology, pain correlates to the trapezius region on the right.  NSAIDs as above and neck exercises given.  Zola Button, MD [place]

## 2021-01-11 NOTE — Patient Instructions (Addendum)
It was nice seeing you today!  Try using a cock-up wrist splint or a wrist splint specific for carpal tunnel. Use this at night.  Take naproxen twice daily as needed for pain.  Stay well, Jennifer Button, MD [place] [phone]    Therapy and Counseling Resources Most providers on this list will take Medicaid. Patients with commercial insurance or Medicare should contact their insurance company to get a list of in network providers.  [facility]:Psychiatry and Counseling [address] [facility] [address] [place], [place] [address] [place], [place], [place] [address]      [place] [address]  [place], [place] [address] [phone]  [place] [address], [place]  [place], [place] [address]       [phone]     [facility] (virtual only) [phone]  Jinny Blossom [facility] [address], [place], [place]  [facility]:  [place]. [place] [phone]  [facility]:  [place] [address] [place] [phone]  [facility] (under & uninsured) [address], [place] [phone]    [email or web address]    [place] [address]  [place]    [phone]  [facility] [place]     Phone:  [phone]     [place]  [place] [address]      [place], [place] ext [place]: [place], [place], [place]   [place] ([place] therapist) [email or web address]  [place] [address]   [place] [address]    [phone]    [facility]   [address],  [place], [place]   [place]  [place]  [facility]  [address] [place], [place]        [phone]  Open Access/Walk In Clinic under & uninsured  [facility]  [address] [place], [place] Crisis [phone]  [facility] [place],  ([place])   [place], [place]: [phone]) 8:30 - 12; 1 - 2:30  [facility] of the [place],  [place], [place]    [phone]):8:30 - 12; 2 - 3PM  [facility] [employer],  [address],  [place]; [phone]):   Mon - Fri 8 AM - 5 PM  [facility] [place]  MWF 12:30 to 3:00 or call to schedule an appointment  [phone]  Specific Provider options Psychology Today  [email or web address] click on find a therapist  enter your zip code left side and select or tailor a therapist for your specific need.   [facility] Provider Directory [email or web address]  (Medicaid)   Follow all drop down to find a provider  [place] or [email or web address] [address], [place], [place] Recovery support and educational   24- Hour Availability:   [facility]  [address] [place], [place] Crisis [phone]  [facility] the [employer] [phone]  [employer]  [phone]  [place]  [phone] (after hours)  Therapeutic Alternative/Mobile Crisis   [phone]  [place] National Suicide Hotline  8382622542 Diamantina Monks)  Call 911 or go to emergency room  [facility]  [phone]);  [place] and [employer]  [phone]); [place], [place], [place], [place], [place], [place], [place]

## 2021-01-11 NOTE — Assessment & Plan Note (Addendum)
Right wrist pain with positive flick test likely secondary to carpal tunnel.  We will trial wrist splint at night and NSAIDs as needed.

## 2021-01-14 DIAGNOSIS — Z419 Encounter for procedure for purposes other than remedying health state, unspecified: Secondary | ICD-10-CM | POA: Diagnosis not present

## 2021-02-14 DIAGNOSIS — Z419 Encounter for procedure for purposes other than remedying health state, unspecified: Secondary | ICD-10-CM | POA: Diagnosis not present

## 2021-03-11 NOTE — Patient Instructions (Addendum)
It was nice seeing you today!  Take antibiotics and use antibiotic ointment as prescribed.  You can try cock up wrist splint or carpal tunnel wrist splint for wrist pain.  You can try some warm water soaks for your thumb.  Let us know if your ear is not getting better.  Schedule your physical in the next several weeks at your convenience.  Stay well, Zola Button, MD [place] [phone]  --  Make sure to check out at the front desk before you leave today.  Please arrive at least 15 minutes prior to your scheduled appointments.  If you had blood work today, I will send you a MyChart message or a letter if results are normal. Otherwise, I will give you a call.  If you had a referral placed, they will call you to set up an appointment. Please give [place] a call if you don't hear back in the next 2 weeks.  If you need additional refills before your next appointment, please call your pharmacy first.

## 2021-03-11 NOTE — Progress Notes (Signed)
° ° °  SUBJECTIVE:   CHIEF COMPLAINT / HPI:  Chief Complaint  Patient presents with   Follow-up    Lump on left ear which is mildly painful started 9 days ago. She has noticed some drainage. Denies fever and chills.  Lump on right thumb beneath nail which she started to notice a few days ago.  Also abnormal indentation in nail with dark stripe.  Lump does not bother her whatsoever.  Received hepatitis B booster in November for work due to low titer and no records. Needs titer re-check today.  She is still having some wrist pain.  She has not yet started using a wrist splint as discussed last visit.  Pain is minimally bothersome.  PERTINENT  PMH / PSH: Carpal tunnel, depression  Patient Care Team: Zola Button, MD as PCP - General (Family Medicine)   OBJECTIVE:   BP 112/83    Pulse 80    Ht 5\' 4"  (1.626 m)    Wt 155 lb 2 oz (70.4 kg)    SpO2 100%    BMI 26.63 kg/m   Physical Exam Constitutional:      General: She is not in acute distress. HENT:     Head: Normocephalic and atraumatic.     Left Ear: Tympanic membrane normal.     Ears:     Comments: Small area of erythema and crusting at the crux of helix on the left.  No active drainage. Cardiovascular:     Rate and Rhythm: Normal rate and regular rhythm.  Pulmonary:     Effort: Pulmonary effort is normal. No respiratory distress.     Breath sounds: Normal breath sounds.  Musculoskeletal:     Cervical back: Neck supple.  Neurological:     Mental Status: She is alert.         Depression screen PHQ 2/9 01/11/2021  Decreased Interest 1  Down, Depressed, Hopeless 3  PHQ - 2 Score 4  Altered sleeping 2  Tired, decreased energy 3  Change in appetite 2  Feeling bad or failure about yourself  1  Trouble concentrating 0  Moving slowly or fidgety/restless 0  Suicidal thoughts 0  PHQ-9 Score 12  Difficult doing work/chores Not difficult at all     {Show previous vital signs (optional):23777}    ASSESSMENT/PLAN:      Cellulitis Irritation in left outer ear possibly superficial skin infection vs eczema. Will treat with doxycycline and mupirocin for possible infection.  Return if not improving.  Koilonychia Abnormal indentation in nail with hyperpigmented longitudinal discoloration.  There is some semblance of koilonychia though not overt, but will check ferritin to rule out iron deficiency. - Ferritin - can try warm water soaks  Nonimmune to hepatitis B virus Received booster 2 months ago for work, re-checking titers today to assess response. - Hepatitis B surface antibody,quantitative    Return in about 4 weeks (around 04/09/2021) for physical.   Zola Button, MD [place]

## 2021-03-12 ENCOUNTER — Ambulatory Visit (INDEPENDENT_AMBULATORY_CARE_PROVIDER_SITE_OTHER): Payer: Medicaid Other | Admitting: Family Medicine

## 2021-03-12 ENCOUNTER — Encounter: Payer: Self-pay | Admitting: Family Medicine

## 2021-03-12 ENCOUNTER — Other Ambulatory Visit: Payer: Self-pay

## 2021-03-12 VITALS — BP 112/83 | HR 80 | Ht 64.0 in | Wt 155.1 lb

## 2021-03-12 DIAGNOSIS — L603 Nail dystrophy: Secondary | ICD-10-CM | POA: Diagnosis not present

## 2021-03-12 DIAGNOSIS — L03811 Cellulitis of head [any part, except face]: Secondary | ICD-10-CM

## 2021-03-12 DIAGNOSIS — Z789 Other specified health status: Secondary | ICD-10-CM

## 2021-03-12 MED ORDER — MUPIROCIN CALCIUM 2 % EX CREA: 1.0000 "application " | TOPICAL_CREAM | Freq: Three times a day (TID) | CUTANEOUS | 0 refills | Status: DC

## 2021-03-12 MED ORDER — DOXYCYCLINE HYCLATE 100 MG PO TABS: 100.0000 mg | ORAL_TABLET | Freq: Two times a day (BID) | ORAL | 0 refills | Status: AC

## 2021-03-13 LAB — HEPATITIS B SURFACE ANTIBODY, QUANTITATIVE: Hepatitis B Surf Ab Quant: 212.9 m[IU]/mL (ref >9.9)

## 2021-03-13 LAB — FERRITIN: Ferritin: 218 ng/mL — ABNORMAL HIGH (ref 15–150)

## 2021-03-15 ENCOUNTER — Telehealth: Payer: Self-pay

## 2021-03-15 MED ORDER — MUPIROCIN 2 % EX OINT: 1.0000 "application " | TOPICAL_OINTMENT | Freq: Three times a day (TID) | CUTANEOUS | 0 refills | Status: AC

## 2021-03-15 NOTE — Telephone Encounter (Signed)
Bactroban ointment rx sent in

## 2021-03-15 NOTE — Telephone Encounter (Signed)
Patient calls nurse line reporting difficulties picking up Bactroban.  Medicaid will only cover the ointment vs cream.   Please send in ointment if appropriate.

## 2021-03-17 DIAGNOSIS — Z419 Encounter for procedure for purposes other than remedying health state, unspecified: Secondary | ICD-10-CM | POA: Diagnosis not present

## 2021-04-04 ENCOUNTER — Emergency Department (HOSPITAL_COMMUNITY): Payer: Medicaid Other

## 2021-04-04 ENCOUNTER — Encounter (HOSPITAL_COMMUNITY): Payer: Self-pay | Admitting: Emergency Medicine

## 2021-04-04 ENCOUNTER — Emergency Department (HOSPITAL_COMMUNITY)
Admission: EM | Admit: 2021-04-04 | Discharge: 2021-04-04 | Disposition: A | Discharge status: Home / Self Care | Payer: Medicaid Other | Attending: Emergency Medicine | Admitting: Emergency Medicine

## 2021-04-04 DIAGNOSIS — M79671 Pain in right foot: Secondary | ICD-10-CM | POA: Insufficient documentation

## 2021-04-04 DIAGNOSIS — M542 Cervicalgia: Secondary | ICD-10-CM | POA: Diagnosis not present

## 2021-04-04 DIAGNOSIS — Z041 Encounter for examination and observation following transport accident: Secondary | ICD-10-CM | POA: Diagnosis not present

## 2021-04-04 DIAGNOSIS — M25531 Pain in right wrist: Secondary | ICD-10-CM | POA: Insufficient documentation

## 2021-04-04 DIAGNOSIS — M79641 Pain in right hand: Secondary | ICD-10-CM | POA: Diagnosis not present

## 2021-04-04 DIAGNOSIS — Y92410 Unspecified street and highway as the place of occurrence of the external cause: Secondary | ICD-10-CM | POA: Diagnosis not present

## 2021-04-04 DIAGNOSIS — M25561 Pain in right knee: Secondary | ICD-10-CM | POA: Diagnosis not present

## 2021-04-04 DIAGNOSIS — M25562 Pain in left knee: Secondary | ICD-10-CM | POA: Insufficient documentation

## 2021-04-04 DIAGNOSIS — S0990XA Unspecified injury of head, initial encounter: Secondary | ICD-10-CM | POA: Insufficient documentation
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
## 2021-04-05 ENCOUNTER — Telehealth: Payer: Self-pay | Admitting: *Deleted

## 2021-04-05 NOTE — Telephone Encounter (Signed)
Transition Care Management Unsuccessful Follow-up Telephone Call  Date of discharge and from where:  04/04/2021 - [facility]  Attempts:  1st Attempt  Reason for unsuccessful TCM follow-up call:  Unable to reach patient - call would not connect x2

## 2021-04-05 NOTE — Patient Instructions (Incomplete)
It was nice seeing you today!  Blood work today.  See me in 3 months or whenever is a good for you.  Stay well, Tahirih Lair, MD [facility] [phone]  --  Make sure to check out at the front desk before you leave today.  Please arrive at least 15 minutes prior to your scheduled appointments.  If you had blood work today, I will send you a MyChart message or a letter if results are normal. Otherwise, I will give you a call.  If you had a referral placed, they will call you to set up an appointment. Please give us a call if you don't hear back in the next 2 weeks.  If you need additional refills before your next appointment, please call your pharmacy first.  

## 2021-04-05 NOTE — Progress Notes (Unsigned)
° ° °  SUBJECTIVE:   CHIEF COMPLAINT / HPI:  No chief complaint on file.   ***  PERTINENT  PMH / PSH: ***  Patient Care Team: Zola Button, MD as PCP - General (Family Medicine)   OBJECTIVE:   There were no vitals taken for this visit.  Physical Exam   Depression screen PHQ 2/9 01/11/2021  Decreased Interest 1  Down, Depressed, Hopeless 3  PHQ - 2 Score 4  Altered sleeping 2  Tired, decreased energy 3  Change in appetite 2  Feeling bad or failure about yourself  1  Trouble concentrating 0  Moving slowly or fidgety/restless 0  Suicidal thoughts 0  PHQ-9 Score 12  Difficult doing work/chores Not difficult at all     {Show previous vital signs (optional):23777}  {Labs   Heme   Chem   Endocrine   Serology   Results Review (optional):23779}  ASSESSMENT/PLAN:   No problem-specific Assessment & Plan notes found for this encounter.    No follow-ups on file.   Zola Button, MD [place]

## 2021-04-06 ENCOUNTER — Ambulatory Visit: Payer: Medicaid Other | Admitting: Family Medicine

## 2021-04-06 NOTE — Telephone Encounter (Signed)
Transition Care Management Unsuccessful Follow-up Telephone Call  Date of discharge and from where:  04/04/2021 - [facility]  Attempts:  2nd Attempt  Reason for unsuccessful TCM follow-up call:  Unable to reach patient - call would not connect

## 2021-04-08 NOTE — Telephone Encounter (Signed)
Transition Care Management Follow-up Telephone Call Date of discharge and from where: 04/04/2021 - [facility] How have you been since you were released from the hospital? "Kinda sore" Any questions or concerns? No  Items Reviewed: Did the pt receive and understand the discharge instructions provided? Yes  Medications obtained and verified? Yes  Other? No  Any new allergies since your discharge? No  Dietary orders reviewed? No Do you have support at home? Yes    Functional Questionnaire: (I = Independent and D = Dependent) ADLs: I  Bathing/Dressing- I  Meal Prep- I  Eating- I  Maintaining continence- I  Transferring/Ambulation- I  Managing Meds- I  Follow up appointments reviewed:  PCP Hospital f/u appt confirmed? Yes  Scheduled to see Dr. Kasandra Knudsen on 04/26/2021 @ 1050. [facility] f/u appt confirmed? No   Are transportation arrangements needed? No  If their condition worsens, is the pt aware to call PCP or go to the Emergency Dept.? Yes Was the patient provided with contact information for the PCP's office or ED? Yes Was to pt encouraged to call back with questions or concerns? Yes

## 2021-04-14 DIAGNOSIS — Z419 Encounter for procedure for purposes other than remedying health state, unspecified: Secondary | ICD-10-CM | POA: Diagnosis not present

## 2021-04-26 ENCOUNTER — Ambulatory Visit (INDEPENDENT_AMBULATORY_CARE_PROVIDER_SITE_OTHER): Payer: Medicaid Other | Admitting: Family Medicine

## 2021-04-26 ENCOUNTER — Encounter: Payer: Self-pay | Admitting: Family Medicine

## 2021-04-26 ENCOUNTER — Other Ambulatory Visit: Payer: Self-pay

## 2021-04-26 VITALS — BP 119/79 | HR 87 | Ht 64.0 in | Wt 153.0 lb

## 2021-04-26 DIAGNOSIS — Z1231 Encounter for screening mammogram for malignant neoplasm of breast: Secondary | ICD-10-CM | POA: Diagnosis not present

## 2021-04-26 DIAGNOSIS — Z1211 Encounter for screening for malignant neoplasm of colon: Secondary | ICD-10-CM | POA: Diagnosis not present

## 2021-04-26 DIAGNOSIS — Z0000 Encounter for general adult medical examination without abnormal findings: Secondary | ICD-10-CM
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
## 2021-04-26 NOTE — Patient Instructions (Addendum)
It was nice seeing you today! ? ?Schedule your pap smear at your convenience. ? ?Stay well, ?Zola Button, MD ?[place] ?([phone] ? ?-- ? ?Make sure to check out at the front desk before you leave today. ? ?Please arrive at least 15 minutes prior to your scheduled appointments. ? ?If you had blood work today, I will send you a MyChart message or a letter if results are normal. Otherwise, I will give you a call. ? ?If you had a referral placed, they will call you to set up an appointment. Please give [place] a call if you don't hear back in the next 2 weeks. ? ?If you need additional refills before your next appointment, please call your pharmacy first.  ?

## 2021-05-15 DIAGNOSIS — Z419 Encounter for procedure for purposes other than remedying health state, unspecified: Secondary | ICD-10-CM | POA: Diagnosis not present

## 2021-06-03 ENCOUNTER — Ambulatory Visit: Payer: Medicaid Other

## 2021-06-14 DIAGNOSIS — Z419 Encounter for procedure for purposes other than remedying health state, unspecified: Secondary | ICD-10-CM | POA: Diagnosis not present

## 2021-06-21 ENCOUNTER — Telehealth: Payer: Self-pay

## 2021-06-21 NOTE — Telephone Encounter (Signed)
Opened in error

## 2021-07-09 ENCOUNTER — Ambulatory Visit: Payer: Medicaid Other | Admitting: *Deleted

## 2021-07-09 DIAGNOSIS — Z1211 Encounter for screening for malignant neoplasm of colon: Secondary | ICD-10-CM

## 2021-07-09 NOTE — Progress Notes (Signed)
At beginning of PV pt stopped me and had numerous questions. She did not understand what a colonoscopy was or why she had to have one. I explained procedure and what exam and prep would involve, she said she did not want to do this. She asked if there was anything else she could do, I asked her if there was any family history of colon cancer and she replied there was not. I explained at home testing and she said she would prefer that. I encouraged pt to call her PCP to discuss, she stated she would call them today and wanted to cancel her procedure.

## 2021-07-15 DIAGNOSIS — Z419 Encounter for procedure for purposes other than remedying health state, unspecified: Secondary | ICD-10-CM | POA: Diagnosis not present

## 2021-07-20 ENCOUNTER — Encounter: Payer: Self-pay | Admitting: *Deleted

## 2021-07-22 ENCOUNTER — Other Ambulatory Visit: Payer: Self-pay

## 2021-07-22 ENCOUNTER — Ambulatory Visit (INDEPENDENT_AMBULATORY_CARE_PROVIDER_SITE_OTHER): Payer: Medicaid Other | Admitting: Family Medicine

## 2021-07-22 ENCOUNTER — Encounter: Payer: Self-pay | Admitting: Family Medicine

## 2021-07-22 VITALS — BP 127/73 | HR 97 | Wt 150.2 lb

## 2021-07-22 DIAGNOSIS — M25511 Pain in right shoulder: Secondary | ICD-10-CM | POA: Diagnosis not present

## 2021-07-22 NOTE — Patient Instructions (Addendum)
It was wonderful to see you today.  Please bring ALL of your medications with you to every visit.   Today we talked about:  -You have a trigger point in your upper back.  We briefly discussed injection and you did not want this at the time.  If you change your mind please let us know.  In the meantime you can attempt lidocaine patches and tennis ball massage for relief.  Please be sure to schedule follow up at the front  desk before you leave today.   Please call the clinic at [phone] if your symptoms worsen or you have any concerns. It was our pleasure to serve you.  Dr. Janus Molder

## 2021-07-22 NOTE — Progress Notes (Signed)
    SUBJECTIVE:   CHIEF COMPLAINT / HPI:   Upper back pain X2 weeks worsening over the last week. When certain spot on back is touched or hit pain shoots into her neck, shoulders, and back. She endorses heavy lifting on the job working for [employer]. Denies taking any medication for this problem. Has happened several years ago and resolved on its own but this seems to be lasting longer than usual. Denies history of back injury and surgery.   PERTINENT  PMH / PSH: As above.   OBJECTIVE:   BP 127/73   Pulse 97   Wt 150 lb 3.2 oz (68.1 kg)   SpO2 99%   BMI 25.78 kg/m   General: Appears well, no acute distress. Age appropriate. Respiratory: normal effort MSK: 1cm cutaneous tender swelling located medial mid scapula. Non tender cervical, lumbar musculature.  Skin: Warm and dry, no rashes or bruising noted Neuro: alert and oriented Psych: normal affect   ASSESSMENT/PLAN:   Trigger point of right shoulder region 2 weeks of symptoms, worsening over the last week. When palpated area is tender and radiate outward. Trigger point injection offered today and was declined. Discussed lidocaine patches and massage for relief. Encouraged to follow up for injection if symptoms worsen or fail to resolve.   Gerlene Fee, [place]

## 2021-08-09 ENCOUNTER — Encounter: Payer: Medicaid Other | Admitting: Gastroenterology

## 2021-08-14 DIAGNOSIS — Z419 Encounter for procedure for purposes other than remedying health state, unspecified: Secondary | ICD-10-CM | POA: Diagnosis not present

## 2021-09-14 DIAGNOSIS — Z419 Encounter for procedure for purposes other than remedying health state, unspecified: Secondary | ICD-10-CM | POA: Diagnosis not present

## 2021-10-15 DIAGNOSIS — Z419 Encounter for procedure for purposes other than remedying health state, unspecified: Secondary | ICD-10-CM | POA: Diagnosis not present

## 2021-11-14 DIAGNOSIS — Z419 Encounter for procedure for purposes other than remedying health state, unspecified: Secondary | ICD-10-CM | POA: Diagnosis not present

## 2021-12-15 DIAGNOSIS — Z419 Encounter for procedure for purposes other than remedying health state, unspecified: Secondary | ICD-10-CM | POA: Diagnosis not present

## 2022-01-14 DIAGNOSIS — Z419 Encounter for procedure for purposes other than remedying health state, unspecified: Secondary | ICD-10-CM | POA: Diagnosis not present

## 2022-02-14 DIAGNOSIS — Z419 Encounter for procedure for purposes other than remedying health state, unspecified: Secondary | ICD-10-CM | POA: Diagnosis not present

## 2022-03-17 DIAGNOSIS — Z419 Encounter for procedure for purposes other than remedying health state, unspecified: Secondary | ICD-10-CM | POA: Diagnosis not present

## 2022-04-15 DIAGNOSIS — Z419 Encounter for procedure for purposes other than remedying health state, unspecified: Secondary | ICD-10-CM | POA: Diagnosis not present

## 2022-05-16 DIAGNOSIS — Z419 Encounter for procedure for purposes other than remedying health state, unspecified: Secondary | ICD-10-CM | POA: Diagnosis not present

## 2022-06-15 DIAGNOSIS — Z419 Encounter for procedure for purposes other than remedying health state, unspecified: Secondary | ICD-10-CM | POA: Diagnosis not present

## 2022-07-16 DIAGNOSIS — Z419 Encounter for procedure for purposes other than remedying health state, unspecified: Secondary | ICD-10-CM | POA: Diagnosis not present

## 2022-08-15 DIAGNOSIS — Z419 Encounter for procedure for purposes other than remedying health state, unspecified: Secondary | ICD-10-CM | POA: Diagnosis not present

## 2022-09-15 DIAGNOSIS — Z419 Encounter for procedure for purposes other than remedying health state, unspecified: Secondary | ICD-10-CM | POA: Diagnosis not present

## 2022-10-16 DIAGNOSIS — Z419 Encounter for procedure for purposes other than remedying health state, unspecified: Secondary | ICD-10-CM | POA: Diagnosis not present
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
## 2022-11-15 DIAGNOSIS — Z419 Encounter for procedure for purposes other than remedying health state, unspecified: Secondary | ICD-10-CM | POA: Diagnosis not present

## 2022-11-23 ENCOUNTER — Ambulatory Visit: Payer: Medicaid Other | Admitting: Family Medicine

## 2022-11-23 ENCOUNTER — Encounter: Payer: Self-pay | Admitting: Family Medicine

## 2022-11-23 VITALS — BP 104/75 | HR 96 | Ht 64.0 in | Wt 153.0 lb

## 2022-11-23 DIAGNOSIS — Z720 Tobacco use: Secondary | ICD-10-CM | POA: Diagnosis not present

## 2022-11-23 DIAGNOSIS — M546 Pain in thoracic spine: Secondary | ICD-10-CM | POA: Diagnosis not present

## 2022-11-23 MED ORDER — NICOTINE POLACRILEX 2 MG MT LOZG: 2.0000 mg | LOZENGE | OROMUCOSAL | 0 refills | Status: DC | PRN

## 2022-11-23 MED ORDER — NICOTINE POLACRILEX 2 MG MT GUM: 2.0000 mg | CHEWING_GUM | OROMUCOSAL | 0 refills | Status: DC | PRN

## 2022-11-23 MED ORDER — ACETAMINOPHEN 500 MG PO TABS: 1000.0000 mg | ORAL_TABLET | Freq: Three times a day (TID) | ORAL | 0 refills | Status: DC

## 2022-11-23 MED ORDER — CYCLOBENZAPRINE HCL 10 MG PO TABS
10.0000 mg | ORAL_TABLET | Freq: Three times a day (TID) | ORAL | 0 refills | Status: DC | PRN
Start: 2022-11-23 — End: 2023-03-23

## 2022-11-23 MED ORDER — NAPROXEN 500 MG PO TABS: 500.0000 mg | ORAL_TABLET | Freq: Two times a day (BID) | ORAL | 0 refills | Status: DC

## 2022-11-23 NOTE — Progress Notes (Signed)
    SUBJECTIVE:   CHIEF COMPLAINT: back issue  HPI:   Jennifer Costa is a 57 y.o.  with history notable for mood disorder presenting for back pain.   She reports on Sunday she was at work. Works at [employer]. She was lifting a box. She had the onset of right sided pain in the thoracic area. This has worsened since this time.  She has tried Tylenol ice heat and naproxen without relief.  She denies bowel or bladder incontinence, saddle anesthesia and fevers.  She has no history of cancer.  She is a long-term smoker and smokes about a third of a pack a day.  She is interested in cutting down at least.   PERTINENT  PMH / PSH/Family/Social History : prior back strain noted, received trigger injection   OBJECTIVE:   BP 104/75   Pulse 96   Ht 5\' 4"  (1.626 m)   Wt 153 lb (69.4 kg)   BMI 26.26 kg/m   Today's weight:  Last Weight  Most recent update: 11/23/2022  4:14 PM    Weight  69.4 kg (153 lb)            Review of prior weights: Filed Weights   11/23/22 1614  Weight: 153 lb (69.4 kg)  Regular rate and rhythm on exam lungs are clear bilaterally.  Back is examined there is no rash.  She has tenderness to palpation over the right thoracic paraspinal area.  She has 2+ patellar and upper extremity reflexes.  ASSESSMENT/PLAN:   Assessment & Plan Acute right-sided thoracic back pain Suspect muscle strain.  Given her tobacco use and relatively thin status I am concerned for an osteoporotic fracture given the location of the pain.  This could also just be muscle overuse or myofascial pain.  X-ray has been ordered discussed with her.  Naproxen, Tylenol and Flexeril for pain.  Discussed she cannot work when she was Flexeril she cannot drink alcohol when she uses Flexeril she cannot drive when she uses Flexeril.  A work note was written that she can return on Monday, October 14.  I discussed with her that if she is unable to return to work she will need to call her office for an  appointment as I will not write her out for longer.  I discussed that most likely this back pain will get better. Tobacco use Congratulated her on thinking about quitting.  Discussed options.  Gum provided as well as quit resources.  Healthcare maintenance she is due for a number of items.  She declined flu and COVID today.  Scheduled with her PCP for physical.   Jennifer Starr, MD  Family Medicine Teaching Service  [facility]

## 2022-11-23 NOTE — Patient Instructions (Signed)
It was wonderful to see you today.  Please bring ALL of your medications with you to every visit.   Today we talked about:  -For your back pain I recommend an x-ray.  The address is below this is walk-in you can go anytime.  -Continue ice and heat.  Please stop all over-the-counter Aleve.  -You can take Tylenol 1000 mg 3 times a day for pain.  -I have sent in prescription strength naproxen 500 mg to take twice a day with food.  -I have sent in a muscle relaxer to take up to 3 times a day.  Do not take more frequently than every 8 hours.  Do not drive if you take this medication do not use with alcohol.  I recommend you to return to work on Monday.  Your back pain will likely improve.  Staying active is important.  I do not recommend bedrest.   Tobacco use is damaging to your body. It increases your risk of stroke, heart attack, lung cancer, and serious lung disease in the future. It also reduces your fertility.   Quitting tobacco is the best thing for your health but is a challenge---nicotine, a chemical in cigarettes, is highly addictive.   You can call 1 800 QUIT NOW [phone])113-4453)---you will be connected with a Careers information officer. They can also mail you nicotine gums, lozenges, and patches to quit.   Ask me about patches (which you wear all day) and gums (which you use when you have a craving) to help you quit.   There are safe, effective medications to help you quit--  Varencline---also called Chantix---- is the most common medication used to help people stop smoking. It starts a low dose and is increased. I recommended choosing a quit date then starting the medication 8 days before this. Side effects include mild headache, difficulty sleeping, and odd dreams. The medication is typically very well tolerated.     Bupropion---also called Zyban---- is started 1 week before your quit date. You take 1 pill for three days then increase to 1 pill twice per day. Side effects include a  mild headache and anxiety---this usually goes away. Some patients experience weight loss.    Please follow up in 2 weeks    Thank you for choosing [facility].   Please call [phone] with any questions about today's appointment.  Please be sure to schedule follow up at the front  desk before you leave today.   Terisa Starr, MD  Family Medicine

## 2022-12-16 DIAGNOSIS — Z419 Encounter for procedure for purposes other than remedying health state, unspecified: Secondary | ICD-10-CM | POA: Diagnosis not present

## 2022-12-26 ENCOUNTER — Ambulatory Visit (INDEPENDENT_AMBULATORY_CARE_PROVIDER_SITE_OTHER): Payer: Medicaid Other | Admitting: Family Medicine

## 2022-12-26 ENCOUNTER — Encounter: Payer: Self-pay | Admitting: Family Medicine

## 2022-12-26 VITALS — BP 107/85 | HR 106 | Wt 139.4 lb

## 2022-12-26 DIAGNOSIS — R200 Anesthesia of skin: Secondary | ICD-10-CM | POA: Diagnosis not present

## 2022-12-26 DIAGNOSIS — Z1211 Encounter for screening for malignant neoplasm of colon: Secondary | ICD-10-CM

## 2022-12-26 DIAGNOSIS — Z0000 Encounter for general adult medical examination without abnormal findings: Secondary | ICD-10-CM | POA: Diagnosis not present

## 2022-12-26 DIAGNOSIS — R202 Paresthesia of skin: Secondary | ICD-10-CM

## 2022-12-26 DIAGNOSIS — Z1212 Encounter for screening for malignant neoplasm of rectum: Secondary | ICD-10-CM | POA: Diagnosis not present

## 2022-12-26 MED ORDER — GABAPENTIN 100 MG PO CAPS: 100.0000 mg | ORAL_CAPSULE | Freq: Every day | ORAL | 0 refills | Status: DC

## 2022-12-26 NOTE — Patient Instructions (Addendum)
It was wonderful to see you today!  Today we scheduled several screenings for your health and wellbeing.  Should receive a kit for Cologuard in the mail please follow the instructions and return for colon cancer screening.  Have also put in the order for you to have your mammogram.  This order is good for 1 year and can be completed at the [facility].  There will be a sheet attached at the back of this packet with directions and instructions on where to go and how to obtain your mammogram.  Also schedule an appointment for you on Monday, November 18 to have your annual Pap exam done.  We also discussed the tingling and numbness you have been having in your arms.  I have prescribed gabapentin for you to take at night to help with this.  I have also ordered an x-ray of your cervical spine to make sure that the nerves that supply your arms are not being compressed.  You can have this done at [facility] radiology department whenever you have time.   Please call [phone] with any questions about today's appointment.   If you need any additional refills, please call your pharmacy before calling the office.  Gerrit Heck, DO Family Medicine

## 2022-12-26 NOTE — Assessment & Plan Note (Signed)
Ordered x-ray of the cervical spine to look for stenosis.  Ordered CBC, CMP and TSH as part of annual wellness exam but can also use these to evaluate for electrolyte abnormalities or thyroid issues which could be contributing to her ongoing numbness and tingling of the bilateral upper extremities.  Prescribed gabapentin 100 mg to be taken nightly to help with the numbness.

## 2022-12-26 NOTE — Assessment & Plan Note (Signed)
Ordered Cologuard, mammogram.  Scheduled patient for return visit for Pap test.  Patient declined flu shot.  Ordered CBC, CMP, lipid panel and TSH.

## 2022-12-26 NOTE — Progress Notes (Signed)
    SUBJECTIVE:   CHIEF COMPLAINT / HPI:   57 yo female presenting for annual physical She has a past history of depression and carpal tunnel syndrome. Current medications: Tylenol, naproxen, nicorette gum, is not taking flexeril.  Patient reports bilateral elbow pain and arm numbness.  The pain started about 3 months ago and is worse on the right compared to the left.  She is right-hand dominant.  She does work in a job where she has repeated elbow flexion and extension and she feels that this exacerbates the pain.  The numbness has been ongoing for quite some time.  She could not give a definite answer as to how long.  She denies any weakness or loss of coordination in either arm.  She states the numbness is not constant but happens every night when she goes to sleep.  Health maintenance: needs colonoscopy, pap, mammogram ordered.  declines flu shot today.  PERTINENT  PMH / PSH: See above  OBJECTIVE:   BP 107/85   Pulse (!) 106   Wt 139 lb 6 oz (63.2 kg)   SpO2 100%   BMI 23.92 kg/m   General: A&O, NAD HEENT: No sign of trauma, EOM grossly intact. Mild proptosis bilaterally Cardiac: RRR, no m/r/g Respiratory: CTAB, normal WOB, no w/c/r Extremities: NTTP, no peripheral edema.  5 out of 5 strength bilaterally in the upper extremity.  Limited AROM with pronation in the bilateral upper extremity, full PROM.  No apparent effusions swelling or redness over the joint at the elbow wrist or fingers. Neuro: Normal gait, moves all four extremities appropriately.   ASSESSMENT/PLAN:   Encounter for wellness examination in adult Ordered Cologuard, mammogram.  Scheduled patient for return visit for Pap test.  Patient declined flu shot.  Ordered CBC, CMP, lipid panel and TSH.  Numbness and tingling of both upper extremities Ordered x-ray of the cervical spine to look for stenosis.  Ordered CBC, CMP and TSH as part of annual wellness exam but can also use these to evaluate for electrolyte  abnormalities or thyroid issues which could be contributing to her ongoing numbness and tingling of the bilateral upper extremities.  Prescribed gabapentin 100 mg to be taken nightly to help with the numbness.   Gerrit Heck, DO [facility]

## 2022-12-27 LAB — CBC WITH DIFFERENTIAL/PLATELET
Basophils Absolute: 0.1 10*3/uL (ref 0.0–0.2)
Basos: 1 %
EOS (ABSOLUTE): 1.1 10*3/uL — ABNORMAL HIGH (ref 0.0–0.4)
Eos: 9 %
Hematocrit: 45.8 % (ref 34.0–46.6)
Hemoglobin: 14.8 g/dL (ref 11.1–15.9)
Immature Grans (Abs): 0 10*3/uL (ref 0.0–0.1)
Immature Granulocytes: 0 %
Lymphocytes Absolute: 3.6 10*3/uL — ABNORMAL HIGH (ref 0.7–3.1)
Lymphs: 31 %
MCH: 31.1 pg (ref 26.6–33.0)
MCHC: 32.3 g/dL (ref 31.5–35.7)
MCV: 96 fL (ref 79–97)
Monocytes Absolute: 0.7 10*3/uL (ref 0.1–0.9)
Monocytes: 6 %
Neutrophils Absolute: 6.1 10*3/uL (ref 1.4–7.0)
Neutrophils: 53 %
Platelets: 424 10*3/uL (ref 150–450)
RBC: 4.76 x10E6/uL (ref 3.77–5.28)
RDW: 13.1 % (ref 11.7–15.4)
WBC: 11.6 10*3/uL — ABNORMAL HIGH (ref 3.4–10.8)

## 2022-12-27 LAB — COMPREHENSIVE METABOLIC PANEL
ALT: 18 [IU]/L (ref 0–32)
AST: 25 [IU]/L (ref 0–40)
Albumin: 4.5 g/dL (ref 3.8–4.9)
Alkaline Phosphatase: 84 [IU]/L (ref 44–121)
BUN/Creatinine Ratio: 16 (ref 9–23)
BUN: 15 mg/dL (ref 6–24)
Bilirubin Total: 0.3 mg/dL (ref 0.0–1.2)
CO2: 23 mmol/L (ref 20–29)
Calcium: 9.6 mg/dL (ref 8.7–10.2)
Chloride: 98 mmol/L (ref 96–106)
Creatinine, Ser: 0.96 mg/dL (ref 0.57–1.00)
Globulin, Total: 2.8 g/dL (ref 1.5–4.5)
Glucose: 98 mg/dL (ref 70–99)
Potassium: 4.1 mmol/L (ref 3.5–5.2)
Sodium: 138 mmol/L (ref 134–144)
Total Protein: 7.3 g/dL (ref 6.0–8.5)
eGFR: 69 mL/min/{1.73_m2} (ref >59)

## 2022-12-27 LAB — LIPID PANEL
Chol/HDL Ratio: 2.6 ratio (ref 0.0–4.4)
Cholesterol, Total: 182 mg/dL (ref 100–199)
HDL: 69 mg/dL (ref >39)
LDL Chol Calc (NIH): 78 mg/dL (ref 0–99)
Triglycerides: 215 mg/dL — ABNORMAL HIGH (ref 0–149)
VLDL Cholesterol Cal: 35 mg/dL (ref 5–40)

## 2022-12-27 LAB — TSH: TSH: 1.47 u[IU]/mL (ref 0.450–4.500)

## 2023-01-02 ENCOUNTER — Ambulatory Visit (INDEPENDENT_AMBULATORY_CARE_PROVIDER_SITE_OTHER): Payer: Medicaid Other | Admitting: Family Medicine

## 2023-01-02 ENCOUNTER — Encounter: Payer: Self-pay | Admitting: Family Medicine

## 2023-01-02 ENCOUNTER — Other Ambulatory Visit (HOSPITAL_COMMUNITY)
Admission: RE | Admit: 2023-01-02 | Discharge: 2023-01-02 | Disposition: A | Discharge status: Home / Self Care | Payer: Medicaid Other | Source: Ambulatory Visit | Attending: Family Medicine | Admitting: Family Medicine

## 2023-01-02 VITALS — BP 110/75 | HR 91 | Ht 64.0 in | Wt 141.6 lb

## 2023-01-02 DIAGNOSIS — Z01419 Encounter for gynecological examination (general) (routine) without abnormal findings: Secondary | ICD-10-CM | POA: Diagnosis not present

## 2023-01-02 NOTE — Addendum Note (Signed)
Addended by: Jennette Bill on: 01/02/2023 02:52 PM   Modules accepted: Orders

## 2023-01-02 NOTE — Progress Notes (Signed)
    SUBJECTIVE:   CHIEF COMPLAINT / HPI:   Presents today for annual gynecologic exam.  She has no current concerns but has not had an exam in some time.  Patient also reports that she has not yet had a chance to complete the x-ray that was ordered at her previous visit for her elbow.  Discussed with patient that once that is done she should call and make a follow-up appointment for that issue.  PERTINENT  PMH / PSH: none  OBJECTIVE:   BP 110/75   Pulse 91   Ht 5\' 4"  (1.626 m)   Wt 141 lb 9.6 oz (64.2 kg)   SpO2 98%   BMI 24.31 kg/m   GU: Normal external female genitalia.  No evidence of ulcers or lesions on the labia majora or minora.  Normal unbroken vaginal mucosa.  Cervix is midline consistent with multiparous anatomy.  No presence of cysts or other lesions on the cervix or vaginal mucosa.  ASSESSMENT/PLAN:   Well woman exam Pap exam completed today.  Cytology orders placed along with GC chlamydia trichomonas and BV testing via Aptima swab.  RPR and HIV blood tests ordered but unable to be completed due to poor veins.  Scheduled lab only appointment for tomorrow at 4 PM.  Given patient's joint pain, hand and foot lesions have moderate suspicion for possibility of syphilis infection.   Gerrit Heck, DO [facility]

## 2023-01-02 NOTE — Assessment & Plan Note (Addendum)
Pap exam completed today.  Cytology orders placed along with GC chlamydia trichomonas and BV testing via Aptima swab.  RPR and HIV blood tests ordered but unable to be completed due to poor veins.  Scheduled lab only appointment for tomorrow at 4 PM.  Given patient's joint pain, hand and foot lesions have moderate suspicion for possibility of syphilis infection.

## 2023-01-02 NOTE — Patient Instructions (Addendum)
It was wonderful to see you today!  Today you were seen for your well woman exam. As a part of this, we did a cytology swab, as well as some routine STI testing. You will receive a message from me, either by phone or in mychart when these results are available.    Please call [phone] with any questions about today's appointment.   If you need any additional refills, please call your pharmacy before calling the office.  Gerrit Heck, DO Family Medicine

## 2023-01-03 ENCOUNTER — Other Ambulatory Visit: Payer: Self-pay

## 2023-01-03 DIAGNOSIS — Z01419 Encounter for gynecological examination (general) (routine) without abnormal findings: Secondary | ICD-10-CM | POA: Diagnosis not present

## 2023-01-03 DIAGNOSIS — R200 Anesthesia of skin: Secondary | ICD-10-CM

## 2023-01-03 LAB — CERVICOVAGINAL ANCILLARY ONLY
Bacterial Vaginitis (gardnerella): NEGATIVE
Candida Glabrata: NEGATIVE
Candida Vaginitis: POSITIVE — AB
Chlamydia: NEGATIVE
Comment: NEGATIVE
Comment: NEGATIVE
Comment: NEGATIVE
Comment: NEGATIVE
Comment: NEGATIVE
Comment: NORMAL
Neisseria Gonorrhea: NEGATIVE
Trichomonas: NEGATIVE

## 2023-01-03 LAB — CYTOLOGY - PAP
Comment: NEGATIVE
Diagnosis: NEGATIVE
High risk HPV: NEGATIVE

## 2023-01-04 LAB — HIV ANTIBODY (ROUTINE TESTING W REFLEX): HIV Screen 4th Generation wRfx: NONREACTIVE

## 2023-01-04 LAB — RPR: RPR Ser Ql: NONREACTIVE

## 2023-01-05 ENCOUNTER — Telehealth: Payer: Self-pay

## 2023-01-05 NOTE — Telephone Encounter (Signed)
Attempted to reach patient. No answer. LVM of order of x-ray at [facility]. Aquilla Solian, CMA

## 2023-01-05 NOTE — Telephone Encounter (Signed)
2nd attempt to reach patient to inform of X-ray order at [facility]. No answer. LVM. Aquilla Solian, CMA

## 2023-01-09 ENCOUNTER — Ambulatory Visit (HOSPITAL_COMMUNITY)
Admission: RE | Admit: 2023-01-09 | Discharge: 2023-01-09 | Disposition: A | Discharge status: Home / Self Care | Payer: Medicaid Other | Source: Ambulatory Visit | Attending: Family Medicine | Admitting: Family Medicine

## 2023-01-09 ENCOUNTER — Encounter (HOSPITAL_COMMUNITY): Payer: Self-pay

## 2023-01-09 DIAGNOSIS — R200 Anesthesia of skin: Secondary | ICD-10-CM | POA: Insufficient documentation

## 2023-01-09 DIAGNOSIS — R202 Paresthesia of skin: Secondary | ICD-10-CM | POA: Diagnosis not present

## 2023-01-15 DIAGNOSIS — Z419 Encounter for procedure for purposes other than remedying health state, unspecified: Secondary | ICD-10-CM | POA: Diagnosis not present

## 2023-02-15 DIAGNOSIS — Z419 Encounter for procedure for purposes other than remedying health state, unspecified: Secondary | ICD-10-CM | POA: Diagnosis not present

## 2023-02-21 ENCOUNTER — Ambulatory Visit: Payer: Medicaid Other

## 2023-02-21 VITALS — BP 113/81 | HR 107 | Ht 64.0 in | Wt 139.0 lb

## 2023-02-21 DIAGNOSIS — M25511 Pain in right shoulder: Secondary | ICD-10-CM | POA: Diagnosis not present

## 2023-02-21 DIAGNOSIS — J069 Acute upper respiratory infection, unspecified: Secondary | ICD-10-CM

## 2023-02-21 DIAGNOSIS — B079 Viral wart, unspecified: Secondary | ICD-10-CM

## 2023-02-21 HISTORY — DX: Acute upper respiratory infection, unspecified: J06.9

## 2023-02-21 MED ORDER — ALBUTEROL SULFATE (2.5 MG/3ML) 0.083% IN NEBU
2.5000 mg | INHALATION_SOLUTION | Freq: Once | RESPIRATORY_TRACT | Status: AC
  Administered 2023-02-21: 2.5 mg via RESPIRATORY_TRACT

## 2023-02-21 MED ORDER — IPRATROPIUM-ALBUTEROL 0.5-2.5 (3) MG/3ML IN SOLN: 3.0000 mL | Freq: Once | RESPIRATORY_TRACT | Status: DC

## 2023-02-21 MED ORDER — IPRATROPIUM BROMIDE 0.02 % IN SOLN
0.5000 mg | Freq: Once | RESPIRATORY_TRACT | Status: AC
  Administered 2023-02-21: 0.5 mg via RESPIRATORY_TRACT

## 2023-02-21 MED ORDER — ALBUTEROL SULFATE HFA 108 (90 BASE) MCG/ACT IN AERS: 2.0000 | INHALATION_SPRAY | RESPIRATORY_TRACT | 0 refills | Status: DC | PRN

## 2023-02-21 NOTE — Patient Instructions (Signed)
 It was great to see you today! Thank you for choosing [facility] for your primary care.  Today we addressed: You have a viral upper respiratory illness.  This is likely worsened by your tobacco use.  We provided a DuoNeb here today to help with your breathing.  I am also going to prescribe you an albuterol  inhaler.  Please use this as needed. In a couple weeks, I recommend you make an appointment with Dr. Koval our clinical pharmacist for lung function testing and tobacco cessation. For your arm pain, I do not see that you have gotten the x-ray that was ordered by Dr. Cleotilde.  Please have that performed.  Please go to [facility] or at [facility] to have this completed.  You do not need an appointment, but if you would like to call them beforehand, their number is [phone].  We will contact you with your results afterwards.   If you haven't already, sign up for My Chart to have easy access to your labs results, and communication with your primary care physician. Call the clinic at [phone] if your symptoms worsen or you have any concerns. Return in about 3 weeks (around 03/14/2023) for PFTs and tobacco cessation with Dr. Koval. Please arrive 15 minutes before your appointment to ensure smooth check in process.  We appreciate your efforts in making this happen.  Thank you for allowing me to participate in your care, Kieth Johnson, DO 02/21/2023, 4:37 PM PGY-3, [facility] Family [facility]

## 2023-02-21 NOTE — Progress Notes (Signed)
  SUBJECTIVE:   CHIEF COMPLAINT / HPI:   Presents today with sore throat, cough, headache, chest and nasal congestion, sweats/chills. Decreased appetite. She has been able to eat/drink some today. Lack of taste. She did not do any flu/covid home testing. She has not checked for fever. Denies taking any OTC medications other than alka seltzer. She smokes 7-8 cigarettes per day but has not smoked the last couple days.   Warts: She has she has had sore spots on her hands and feet for greater than 1 year.  Shoulder: She notes her right shoulder has been bothering her.  She was here a couple months prior for right elbow/upper arm pain.  PERTINENT  PMH / PSH: Tobacco use  OBJECTIVE:  BP 113/81   Pulse (!) 107   Ht 5' 4 (1.626 m)   Wt 139 lb (63 kg)   SpO2 98%   BMI 23.86 kg/m  General: Well-appearing, NAD HEENT: Clear oropharynx, no cervical lymphadenopathy CV: RRR, murmurs auscultated Pulm: Expiratory wheezing throughout, normal WOB MSK: Shoulder, right: No evidence of bony deformity, asymmetry, or muscle atrophy; No tenderness over long head of biceps (bicipital groove). No TTP at [facility] joint. Decreased range of motion of abduction otherwise full active and passive range of motion (180 flex Camelia /150Abd /90ER /70IR). Strength 5/5 throughout. No abnormal scapular function observed. Sensation intact. Peripheral pulses intact. Jobe test positive.  Skin: widespread various-sized hyperkeratotic papules with cauliflower-like base on palmar surfaces of hands/fingers c/w verruca vulgaris    After DuoNeb Pulm: CTAB, normal WOB  ASSESSMENT/PLAN:   Assessment & Plan Viral upper respiratory illness Presenting symptoms suggest viral upper respiratory illness however complicated with active smoker status.  Provided DuoNeb with improvement.  Rx for albuterol  inhaler.  Recommend follow-up with clinical pharmacist for PFTs and tobacco cessation. 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  Advised following up in [facility] skin clinic. Right shoulder pain, unspecified chronicity No change since seen by Dr. Cleotilde on 12/26/2022.  She did not follow-up with receiving x-ray of C-spine.  Recommended obtaining this.  No neurological complications today. Return in about 3 weeks (around 03/14/2023) for PFTs and tobacco cessation with Dr. Koval. Kieth Johnson, DO 02/23/2023, 8:00 AM PGY-3, [facility] Family [facility]

## 2023-02-23 DIAGNOSIS — B079 Viral wart, unspecified: Secondary | ICD-10-CM | POA: Insufficient documentation

## 2023-02-23 NOTE — Assessment & Plan Note (Addendum)
 Widespread on hands and according to clinical history feet as well.  Advised following up in [facility] skin clinic.

## 2023-02-23 NOTE — Assessment & Plan Note (Signed)
 Presenting symptoms suggest viral upper respiratory illness however complicated with active smoker status.  Provided DuoNeb with improvement.  Rx for albuterol inhaler.  Recommend follow-up with clinical pharmacist for PFTs and tobacco cessation.

## 2023-02-24 ENCOUNTER — Other Ambulatory Visit: Payer: Self-pay

## 2023-02-24 DIAGNOSIS — J069 Acute upper respiratory infection, unspecified: Secondary | ICD-10-CM

## 2023-02-24 MED ORDER — ALBUTEROL SULFATE HFA 108 (90 BASE) MCG/ACT IN AERS: 2.0000 | INHALATION_SPRAY | RESPIRATORY_TRACT | 0 refills | Status: DC | PRN

## 2023-03-02 ENCOUNTER — Ambulatory Visit (INDEPENDENT_AMBULATORY_CARE_PROVIDER_SITE_OTHER): Payer: Medicaid Other | Admitting: Pharmacist

## 2023-03-02 ENCOUNTER — Encounter: Payer: Self-pay | Admitting: Pharmacist

## 2023-03-02 VITALS — BP 110/77 | HR 100 | Ht 64.0 in | Wt 138.0 lb

## 2023-03-02 DIAGNOSIS — J449 Chronic obstructive pulmonary disease, unspecified: Secondary | ICD-10-CM | POA: Diagnosis not present

## 2023-03-02 DIAGNOSIS — F17200 Nicotine dependence, unspecified, uncomplicated: Secondary | ICD-10-CM | POA: Diagnosis not present

## 2023-03-02 MED ORDER — NORTRIPTYLINE HCL 25 MG PO CAPS: 25.0000 mg | ORAL_CAPSULE | Freq: Every day | ORAL | 1 refills | Status: DC

## 2023-03-02 MED ORDER — BREZTRI AEROSPHERE 160-9-4.8 MCG/ACT IN AERO: 2.0000 | INHALATION_SPRAY | Freq: Two times a day (BID) | RESPIRATORY_TRACT | Status: DC

## 2023-03-02 NOTE — Assessment & Plan Note (Signed)
Patient has been experiencing shortness of breath for an extended period of time. Patient denies use of any inhaler due to not being able to get recent prescription from her pharmacy.  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  -Begin Breztri  for COPD. Inhale 2 puffs in the morning and 2 puffs in the evening,  -Educated patient on purpose, proper use, potential adverse effects including risk of esophageal candidiasis and need to rinse mouth after each use.  Following instruction patient demonstrated adequate technique with use of inhaler.

## 2023-03-02 NOTE — Patient Instructions (Addendum)
It was nice to see you today!   Medication Changes: START nortriptyline capsule 25 mg once daily at bedtime for sleep. In one week, increase to 2 capsules daily at bedtime.   START Breztri for COPD. Inhale two puffs in the morning and two puffs in the evening. Shake inhaler well before use. Rinse your mouth after using.   Continue all other medication the same.   Goal: Decrease number of cigarettes to 6 (cigarettes) per day.

## 2023-03-02 NOTE — Progress Notes (Signed)
S:     Chief Complaint  Patient presents with   Medication Management    PFT + Tobacco   58 y.o. female who presents for pulmonary evaluation, education, and management.  She is also interested in discussing tobacco intake reduction/cessation. Patient arrives in good spirits and presents without any assistance.  Patient was referred and last seen by Primary Care Provider, Dr. Royal Piedra, on 02/21/2023.    PMH is significant for tobacco use and depression.  Patient reports breathing has been normal, shortness of breath occasionally.   As a teenager, patient reports she ran track and competed successfully running the 100 and 220 meter events.   Medication adherence reported poorly due to unable to obtain the medication on insurance coverage.  Age when started using tobacco on a daily basis 59 yo. Brand smoked [place]. Number of 7 cigarettes/day.  Estimated nicotine content per cigarette (mg) 1.2.  Estimated nicotine intake per day 10 mg.   Reports waking to smoke / Smokes 2 times per night.   Reports smoking 4-5 days/month in between bedtime to morning.    Most recent quit attempt: Longest time ever been tobacco free for 1 week.  Medications used in past cessation efforts include: none  Rates IMPORTANCE of quitting tobacco on 1-10 scale of 10. Rates CONFIDENCE of quitting tobacco on 1-10 scale of 5.  Most common triggers to use tobacco include depression.   Motivation to quit: improve lung function and overall health  O: Review of Systems  Respiratory:  Positive for sputum production and shortness of breath.   Psychiatric/Behavioral:  Positive for depression. The patient has insomnia.   All other systems reviewed and are negative.   Physical Exam Vitals reviewed.  Constitutional:      Appearance: Normal appearance.  Pulmonary:     Effort: Pulmonary effort is normal.  Neurological:     Mental Status: She is alert.  Psychiatric:        Mood and Affect: Mood normal.         Behavior: Behavior normal.        Thought Content: Thought content normal.        Judgment: Judgment normal.    Vitals:   03/02/23 1403  BP: 110/77  Pulse: 100  SpO2: 99%    mMRC score= < 2  See Documentation Flowsheet - CAT/COPD for complete symptom scoring.  See "scanned report" or Documentation Flowsheet (discrete results - PFTs) for Spirometry results. Patient provided good effort while attempting spirometry.   Lung Age = 32 Albuterol Neb  Lot# [id number]     Exp. 11/2023  Patient is participating in a Managed Medicaid Plan:  Yes   A/P: Patient has been experiencing shortness of breath for an extended period of time. Patient denies use of any inhaler due to not being able to get recent prescription from her pharmacy.  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  -Begin Breztri  for COPD. Inhale 2 puffs in the morning and 2 puffs in the evening,  -Educated patient on purpose, proper use, potential adverse effects including risk of esophageal candidiasis and need to rinse mouth after each use.  Following instruction patient demonstrated adequate technique with use of inhaler.   Medication Samples have been provided to the patient. Drug name: Markus Daft       Strength: 17mcg/9mcg/4.8mcg        Qty: 2  LOT: [id number]  Exp.Date: 04/13/2025 Dosing instructions: Inhale 2 puffs BID  The  patient has been instructed regarding the correct time, dose, and frequency of taking this medication, including desired effects and most common side effects.   Madelon Lips 3:48 PM 03/02/2023  -Reviewed results of pulmonary function tests.  Pt verbalized understanding of results and education.     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  -Initiated nortriptyline 25 mg capsule once daily for 1 week, then  increase to 2 capsules once daily at bedtime.   -Set goal: decrease number of cigarettes to 6 cigarettes per day (eliminate 1 cigarettes at bedtime).    Written patient instructions provided.   Total time in face to face counseling 51 minutes.    Follow-up:  Pharmacist 03/16/23 with Dr. Raymondo Band PCP clinic visit 03/09/2023 with Dr. Rondel Baton  Patient seen with Lavona Mound, PharmD Candidate and Laqueta Jean, PharmD Candidate.

## 2023-03-02 NOTE — Assessment & Plan Note (Signed)
  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  -Initiated nortriptyline 25 mg capsule once daily for 1 week, then increase to 2 capsules once daily at bedtime.   -Set goal: decrease number of cigarettes to 6 cigarettes per day (eliminate 1 cigarettes at bedtime).

## 2023-03-06 NOTE — Progress Notes (Signed)
Reviewed and agree with Dr Koval's plan.   

## 2023-03-09 ENCOUNTER — Ambulatory Visit: Payer: Medicaid Other | Admitting: Family Medicine

## 2023-03-09 ENCOUNTER — Encounter: Payer: Self-pay | Admitting: Family Medicine

## 2023-03-09 VITALS — BP 105/80 | HR 93 | Wt 140.0 lb

## 2023-03-09 DIAGNOSIS — S46019A Strain of muscle(s) and tendon(s) of the rotator cuff of unspecified shoulder, initial encounter: Secondary | ICD-10-CM

## 2023-03-09 DIAGNOSIS — S46011D Strain of muscle(s) and tendon(s) of the rotator cuff of right shoulder, subsequent encounter: Secondary | ICD-10-CM

## 2023-03-09 DIAGNOSIS — L84 Corns and callosities: Secondary | ICD-10-CM | POA: Diagnosis not present

## 2023-03-09 DIAGNOSIS — B079 Viral wart, unspecified: Secondary | ICD-10-CM

## 2023-03-09 HISTORY — DX: Strain of muscle(s) and tendon(s) of the rotator cuff of unspecified shoulder, initial encounter: S46.019A

## 2023-03-09 MED ORDER — MELOXICAM 7.5 MG PO TABS: 7.5000 mg | ORAL_TABLET | Freq: Every day | ORAL | 0 refills | Status: DC

## 2023-03-09 NOTE — Assessment & Plan Note (Signed)
Prescribed meloxicam 7.5 mg to be taken daily for 2 weeks and then as needed thereafter.  Provided patient with rotator cuff mobility and strengthening exercises that could be done to help aid in her recovery and advised her to complete the x-rays that were ordered at previous visit with Dr. Royal Piedra.  Will follow-up with me in about 1 month to gauge her progress.  If at that time she is continuing to have ongoing pain and weakness I will refer her to sports medicine for further evaluation and treatment.

## 2023-03-09 NOTE — Progress Notes (Signed)
    SUBJECTIVE:   CHIEF COMPLAINT / HPI:   Started on breztri for COPD, seeing Dr. Raymondo Band for smoking cessation Continuing to have pain in the shoulder and elbow, still has not completed the xrays ordered previously  Pain in elbow has resolved at this time, shoulder pain is unchanged.  She is also having some weakness with rotation in the right arm related to pain.  Main concern today are the painful wartlike lesions on her hands and feet.  She is having trouble wearing shoes due to the painful nature of the lesions on her feet and is in danger of losing her job.  The lesions seem to come and go but she has several large ones on her palms and on the outer edges of her great toes which have been present for months and only seem to be getting larger.  PERTINENT  PMH / PSH: Verruca vulgaris, preulcerative corn or callus  OBJECTIVE:   BP 105/80   Pulse 93   Wt 140 lb (63.5 kg)   SpO2 100%   BMI 24.03 kg/m   General: A&O, NAD Cardiac: RRR, no m/r/g Respiratory: CTAB, normal WOB, no w/c/r Extremities: NTTP, no peripheral edema.  Mild pain with internal and external rotation.  Empty can test positive on the right.  N/V/I bilaterally Skin findings pictured below   ASSESSMENT/PLAN:   Pre-ulcerative corn or callous Consistent diagnosis for lesions noted on the bilateral great toe.  Referral placed for podiatry.  Advised patient that anti-inflammatories would likely help with her pain and that she should use heat and ice as needed and elevate her feet.  Strain of rotator cuff Prescribed meloxicam 7.5 mg to be taken daily for 2 weeks and then as needed thereafter.  Provided patient with rotator cuff mobility and strengthening exercises that could be done to help aid in her recovery and advised her to complete the x-rays that were ordered at previous visit with Dr. Royal Piedra.  Will follow-up with me in about 1 month to gauge her progress.  If at that time she is continuing to have ongoing pain  and weakness I will refer her to sports medicine for further evaluation and treatment.   Gerrit Heck, DO [facility]

## 2023-03-09 NOTE — Assessment & Plan Note (Signed)
Consistent diagnosis for lesions noted on the bilateral great toe.  Referral placed for podiatry.  Advised patient that anti-inflammatories would likely help with her pain and that she should use heat and ice as needed and elevate her feet.

## 2023-03-09 NOTE — Patient Instructions (Addendum)
It was wonderful to see you today!  Today you were seen for your shoulder pain and for your warts/corns.  For your shoulder pain I have prescribed meloxicam and provided a set of exercises that you can do to strengthen and relieve pain in your rotator cuff.  Like to see you back in 4 weeks to see how this treatment has helped and reevaluate your pain.  If it has not improved we may need to refer you to a specialist.  Because your warts are very thick and several of the lesions on your feet appear to be corns I have placed a referral for you to see a podiatrist.  They will be able to better manage these lesions and provide solutions that we will do more for you in the long run as far as relieving your pain.  Should receive a phone call from them to schedule an appointment within the next couple of weeks.  If you have not heard back from them by the time you come back to see me next month please let me know so that I can check on this referral.  Please call [phone] with any questions about today's appointment.   If you need any additional refills, please call your pharmacy before calling the office.  Gerrit Heck, DO Family Medicine

## 2023-03-16 ENCOUNTER — Ambulatory Visit: Payer: Medicaid Other | Admitting: Pharmacist

## 2023-03-16 ENCOUNTER — Encounter: Payer: Self-pay | Admitting: Pharmacist

## 2023-03-16 VITALS — BP 109/73 | HR 85 | Wt 142.0 lb

## 2023-03-16 DIAGNOSIS — F17200 Nicotine dependence, unspecified, uncomplicated: Secondary | ICD-10-CM | POA: Diagnosis not present

## 2023-03-16 MED ORDER — NICOTINE POLACRILEX 2 MG MT GUM: 2.0000 mg | CHEWING_GUM | OROMUCOSAL | 3 refills | Status: DC | PRN

## 2023-03-16 NOTE — Patient Instructions (Signed)
Nice to see you today!   Medication Changes: Continue the Nortriptyline 2 at night  START nicotine gum 1 piece should replace 1 cigarette use  Goal over the next few weeks is to decrease smoking from 7 to 5 cigarettes per day.  Use 2-3 pieces of gum per day to help!   Tobacco Patient Instructions  Quitting smoking is one of the most important decisions you can make for your current and future health. Consider what you dislike about smoking and how quitting could personally benefit you. Try to cut down.    Starting today, Be a Quitter by Next YEAR - or sooner!!!  Remind yourself why you want to quit.  Delay your first cigarette of the day for as long as possible.  Start cleaning out all pockets, drawers, and your car of cigarettes.  Getting Through the Cravings Once You Are Smoke Free: Each craving will last about 10 minutes, whether or not you smoke. Here's how to get through the cravings without cigarettes:  DELAY: Tell yourself that you'll wait for the next craving. Do it every time! DEEP BREATHS: One reason smoking feels good is because you breathe in deeply to inhale. Take four slow, deep breaths and feel the relaxation without the hamful effects of cigarettes. DRINK WATER: Drink a glass of cool water. It will give your hands and mouth something to do and will help flush the nicotine out of your system faster. DIVERT: Do something else -- brush your teeth, take a walk, call a friend who can offer you support. Just moving onto something other than thinking about cigarettes will move you through the craving.   Frequently Asked Questions  What can I do when I get the urge to smoke? To get through the urge to smoke, try the following:  Review your reasons for quitting and think of all the benefits to your health, your finances, and your family.  Remind yourself that there is no such thing as just one cigarette -- or even one puff.  Ride out the desire to smoke. Use the 4 Os --  Delay, Deep Breaths, Drink Water and Divert to get you through. The craving will go away eventually. Do not fool yourself into thinking you can have just one cigarette.  Any tips on how to deal with stress? Stress is a natural part of life. The key is to deal with it without reaching for a cigarette. Taking deep breaths, counting backwards from 10 and asking yourself 1-how big a deal is this?"  Writing down your feelings, talking with a friend and doing things like positive self-talk and meditation are some other ways that people deal with daily stress.  What if I start smoking again? Slips happen. Most people try to quit smoking a few times before they are successful. Don't beat yourself up if this happens to you! Ask yourself if this was a slip or a relapse. A slip is a one-time mistake that is quickly corrected. A relapse is going back to your old smoking habits.   If you slip, don't give up. Think of it as a learning experience. Ask yourself what went wrong and renew your commitment to staying away from smoking for good.  If you relapse, try not to get discouraged. Ask yourself the question "What caused me to start smoking?" Figure out what helped you and what didn't when you tried to quit. Knowing why you relapsed is useful information for your next attempt to quit.

## 2023-03-16 NOTE — Progress Notes (Signed)
S:   Chief Complaint  Patient presents with   Medication Management   58 y.o. female who presents for evaluation/assistance with tobacco dependence.  PMH is significant for recent spirometry finding of obstruction with reversibility.   Patient was referred and last seen by Primary Care Provider, Dr. Royal Piedra, on 02/21/2023.   At last visit, tobacco intake reduction was discussed and Breztri inhaler was initiated.   Most recent tobacco use is reported as "unchanged from previous"  Patient reports pack lasting 3 days OR about 7 cigs per day.   Medications used in past cessation efforts include: nortriptyline 50 (2X25mg ) Patient reports that she was told by her pharmacy that Medicaid does not cover nicotine gum.   Rates IMPORTANCE of quitting tobacco as high.    O: Clinical ASCVD:  The 10-year ASCVD risk score (Arnett DK, et al., 2019) is: 4.6%   Values used to calculate the score:     Age: 52 years     Sex: Female     Is Non-Hispanic African American: Yes     Diabetic: No     Tobacco smoker: Yes     Systolic Blood Pressure: 109 mmHg     Is BP treated: No     HDL Cholesterol: 69 mg/dL     Total Cholesterol: 182 mg/dL  Review of Systems  Respiratory:  Positive for shortness of breath (less short of breath than previously).   All other systems reviewed and are negative.   Physical Exam Pulmonary:     Effort: Pulmonary effort is normal.  Neurological:     Mental Status: She is alert. Mental status is at baseline.  Psychiatric:        Mood and Affect: Mood normal.        Behavior: Behavior normal.        Thought Content: Thought content normal.        Judgment: Judgment normal.    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 -Continued nortriptyline 50 mg at bedtime, (2 capsules once daily at bedtime) as this has helped patient with sleeping 8 hours.   -Set goal: decrease number of cigarettes from ~ 7 per day to 5 cigarettes per day with use of NRT. -Initiated nicotine replacement tx with nicotine gum 2mg  up to 5 pieces per day PRN craving. Patient counseled on purpose, proper use (chew and park), and potential adverse effects, including GI upset.  [facility] pharmacy and verified that this was covered by Medicaid).  -Provided information on 1 800-QUIT NOW support program as an alternate option to obtain starter supply of nicotine gum.    COPD with reversibility - subjective improved breathing with use of Breztri. Medication Samples have been provided to the patient to continue use as Medicaid fails to cover triple inhalers.  Reassess breathing post tobacco cessation if able to quit in upcoming weeks and consider LABA/Steroid option at that time.  Drug name: Selena Batten: 2 inhalers  LOT: [id number]  Exp.Date: 04/13/2025 Dosing instructions: 2 inhalations BID The patient has been instructed regarding the correct time, dose, and frequency of taking this medication, including desired effects and most common side effects.   Written patient instructions provided.  Patient verbalized understanding of treatment plan.  Total time in face to face counseling 29 minutes.    Follow-up:  Pharmacist 03/30/2023 PCP clinic visit TBD

## 2023-03-16 NOTE — Assessment & Plan Note (Signed)
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 -Continued nortriptyline 50 mg at bedtime, (2 capsules once daily at bedtime) as this has helped patient with sleeping 8 hours.   -Set goal: decrease number of cigarettes from ~ 7 per day to 5 cigarettes per day with use of NRT. -Initiated nicotine replacement tx with nicotine gum 2mg  up to 5 pieces per day PRN craving. Patient counseled on purpose, proper use (chew and park), and potential adverse effects, including GI upset.  -Provided information on 1 800-QUIT NOW support program as an alternate option to obtain starter supply of nicotine gum.

## 2023-03-18 DIAGNOSIS — Z419 Encounter for procedure for purposes other than remedying health state, unspecified: Secondary | ICD-10-CM | POA: Diagnosis not present

## 2023-03-20 NOTE — Progress Notes (Signed)
 Reviewed and agree with Dr Macky Lower plan.

## 2023-03-21 DIAGNOSIS — M1990 Unspecified osteoarthritis, unspecified site: Secondary | ICD-10-CM | POA: Diagnosis not present

## 2023-03-21 DIAGNOSIS — Z833 Family history of diabetes mellitus: Secondary | ICD-10-CM | POA: Diagnosis not present

## 2023-03-21 DIAGNOSIS — K219 Gastro-esophageal reflux disease without esophagitis: Secondary | ICD-10-CM | POA: Diagnosis not present

## 2023-03-21 DIAGNOSIS — Z8249 Family history of ischemic heart disease and other diseases of the circulatory system: Secondary | ICD-10-CM | POA: Diagnosis not present

## 2023-03-21 DIAGNOSIS — Z791 Long term (current) use of non-steroidal anti-inflammatories (NSAID): Secondary | ICD-10-CM | POA: Diagnosis not present

## 2023-03-21 DIAGNOSIS — Z809 Family history of malignant neoplasm, unspecified: Secondary | ICD-10-CM | POA: Diagnosis not present

## 2023-03-23 ENCOUNTER — Ambulatory Visit (INDEPENDENT_AMBULATORY_CARE_PROVIDER_SITE_OTHER): Payer: Medicaid Other | Admitting: Podiatry

## 2023-03-23 DIAGNOSIS — D492 Neoplasm of unspecified behavior of bone, soft tissue, and skin: Secondary | ICD-10-CM

## 2023-03-23 NOTE — Progress Notes (Signed)
  Subjective:  Patient ID: Jennifer Costa, female    DOB: [birth year],  MRN: [id number] HPI Chief Complaint  Patient presents with   Foot Pain    Bilateral 1-2, bottom of foot. Hard nodules that are painful and discolored. Present for a few years. Recently began flaring up again. Not diabetic and no anticoag therapy.     58 y.o. female presents with the above complaint.   ROS: Denies fever chills nausea vomit muscle aches pains calf pain back pain chest pain shortness of breath  Past Medical History:  Diagnosis Date   Arthritis    Unintentional weight loss 01/15/2020   No past surgical history on file.  Current Outpatient Medications:    albuterol  (VENTOLIN  HFA) 108 (90 Base) MCG/ACT inhaler, Inhale 2 puffs into the lungs every 4 (four) hours as needed for wheezing or shortness of breath., Disp: 1 each, Rfl: 0   meloxicam  (MOBIC ) 7.5 MG tablet, Take 1 tablet (7.5 mg total) by mouth daily. For two weeks, then as needed for shoulder pain, Disp: 30 tablet, Rfl: 0   nicotine  polacrilex (NICORETTE ) 2 MG gum, Take 1 each (2 mg total) by mouth as needed for smoking cessation. Use up to 5 pieces per day., Disp: 100 tablet, Rfl: 3   nortriptyline  (PAMELOR ) 25 MG capsule, Take 1-2 capsules (25-50 mg total) by mouth at bedtime. Take 25 mg (1 capsule) daily at bedtime for one week. Then increase to 2 capsules daily at bedtime., Disp: 60 capsule, Rfl: 1  No Known Allergies Review of Systems Objective:  There were no vitals filed for this visit.  General: Well developed, nourished, in no acute distress, alert and oriented x3   Dermatological: Skin is warm, dry and supple bilateral. Nails x 10 are well maintained; remaining integument appears unremarkable at this time. There are no open sores, no preulcerative lesions, no rash or signs of infection present.  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  Vascular: Dorsalis Pedis artery and Posterior Tibial artery pedal pulses are 2/4  bilateral with immedate capillary fill time. Pedal hair growth present. No varicosities and no lower extremity edema present bilateral.   Neruologic: Grossly intact via light touch bilateral. Vibratory intact via tuning fork bilateral. Protective threshold with Semmes Wienstein monofilament intact to all pedal sites bilateral. Patellar and Achilles deep tendon reflexes 2+ bilateral. No Babinski or clonus noted bilateral.   Musculoskeletal: No gross boney pedal deformities bilateral. No pain, crepitus, or limitation noted with foot and ankle range of motion bilateral. Muscular strength 5/5 in all groups tested bilateral.  Severe hammertoe deformity HAV deformities bilateral  Gait: Unassisted, Nonantalgic.    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  Hammertoe deformities hallux valgus deformities.  Plan: Debridement of all these lesions.  She will follow-up with Dr. May for debridement in the near future.     Arthurine Oleary T. [place], [place]

## 2023-03-30 ENCOUNTER — Ambulatory Visit: Payer: Medicaid Other | Admitting: Pharmacist

## 2023-03-30 ENCOUNTER — Encounter: Payer: Self-pay | Admitting: Pharmacist

## 2023-03-30 VITALS — BP 107/74 | HR 83 | Wt 143.0 lb

## 2023-03-30 DIAGNOSIS — F17200 Nicotine dependence, unspecified, uncomplicated: Secondary | ICD-10-CM | POA: Diagnosis not present

## 2023-03-30 NOTE — Progress Notes (Signed)
   S:   Chief Complaint  Patient presents with   Medication Management    Tobacco cessation   58 y.o. female who presents for evaluation/assistance with tobacco dependence.  PMH is significant for tobacco use disorder.   Patient was referred and last seen by Primary Care Provider, Dr. Sherrine Maples, on 02/21/2023.   At last visit, decrease number of cigarettes from ~ 7 per day to 5 cigarettes per day with use of NRT.   Brand smoked [place]. Number of cigarettes/day 7.  Reports waking to smoke.  Medications used in past cessation efforts include: NRT  Rates IMPORTANCE of quitting tobacco on 1-10 scale of 10. Rates CONFIDENCE of quitting tobacco on 1-10 scale of 8.  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   Physical Exam Vitals reviewed.  Constitutional:      Appearance: Normal appearance. She is normal weight.  Pulmonary:     Effort: Pulmonary effort is normal.  Neurological:     Mental Status: She is alert. Mental status is at baseline.  Psychiatric:        Mood and Affect: Mood normal.        Behavior: Behavior normal.        Thought Content: Thought content normal.        Judgment: Judgment normal.    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    -Continued nicotine replacement tx with Nicorette 2 mg gum. Patient counseled on purpose, proper use, and potential adverse effects. -Set goal with patient to decrease number of cigarettes from 7 per day to 5 cigarettes per day with use of NRT  Written patient instructions provided. Patient verbalized understanding of treatment plan.  Total time in face to face counseling 18 minutes.    Follow-up:  Pharmacist 03/04 at 1:30 p.m. PCP clinic visit 03/03 with Dr. Rondel Baton  Patient seen with Lavona Mound, PharmD Candidate.

## 2023-03-30 NOTE — Patient Instructions (Signed)
Nice to see you today! Keep up the good work!   Medication Changes:  Decrease number of cigarettes smoked from 7 to 5 cigarettes per day.   Continue all other medication the same.   Tobacco Patient Instructions:  Quitting smoking is one of the most important decisions you can make for your current and future health. Consider what you dislike about smoking and how quitting could personally benefit you. Try to cut down. Aim for reducing the amount you smoke to 5 cigarettes over the next 3 weeks.  My target quit date is: end of 2025  Starting today, Be a Quitter!  Remind yourself why you want to quit.  Delay your first cigarette of the day for as long as possible.  Start cleaning out all pockets, drawers, and your car of cigarettes.  Getting Through the Cravings Once You Are Smoke Free: Each craving will last about 10 minutes, whether or not you smoke. Here's how to get through the cravings without cigarettes:  DELAY: Tell yourself that you'll wait for the next craving. Do it every time! DEEP BREATHS: One reason smoking feels good is because you breathe in deeply to inhale. Take four slow, deep breaths and feel the relaxation without the hamful effects of cigarettes. DRINK WATER: Drink a glass of cool water. It will give your hands and mouth something to do and will help flush the nicotine out of your system faster. DIVERT: Do something else -- brush your teeth, take a walk, call a friend who can offer you support. Just moving onto something other than thinking about cigarettes will move you through the craving.   Frequently Asked Questions  What can I do when I get the urge to smoke? To get through the urge to smoke, try the following:  Review your reasons for quitting and think of all the benefits to your health, your finances, and your family.  Remind yourself that there is no such thing as just one cigarette -- or even one puff.  Ride out the desire to smoke. Use the 4 Os --  Delay, Deep Breaths, Drink Water and Divert to get you through. The craving will go away eventually. Do not fool yourself into thinking you can have just one cigarette.  Any tips on how to deal with stress? Stress is a natural part of life. The key is to deal with it without reaching for a cigarette. Taking deep breaths, counting backwards from 10 and asking yourself 1-how big a deal is this?"  Writing down your feelings, talking with a friend and doing things like positive self-talk and meditation are some other ways that people deal with daily stress.  What if I start smoking again? Slips happen. Most people try to quit smoking a few times before they are successful. Don't beat yourself up if this happens to you! Ask yourself if this was a slip or a relapse. A slip is a one-time mistake that is quickly corrected. A relapse is going back to your old smoking habits.   If you slip, don't give up. Think of it as a learning experience. Ask yourself what went wrong and renew your commitment to staying away from smoking for good.  If you relapse, try not to get discouraged. Ask yourself the question "What caused me to start smoking?" Figure out what helped you and what didn't when you tried to quit. Knowing why you relapsed is useful information for your next attempt to quit.

## 2023-03-30 NOTE — Assessment & Plan Note (Signed)
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    -Continued nicotine replacement tx with Nicorette 2 mg gum. Patient counseled on purpose, proper use, and potential adverse effects. -Set goal with patient to decrease number of cigarettes from 7 per day to 5 cigarettes per day with use of NRT

## 2023-03-31 NOTE — Progress Notes (Signed)
Reviewed and agree with Dr Macky Lower plan.

## 2023-04-15 DIAGNOSIS — Z419 Encounter for procedure for purposes other than remedying health state, unspecified: Secondary | ICD-10-CM | POA: Diagnosis not present

## 2023-04-17 ENCOUNTER — Ambulatory Visit (INDEPENDENT_AMBULATORY_CARE_PROVIDER_SITE_OTHER): Payer: Self-pay | Admitting: Family Medicine

## 2023-04-17 ENCOUNTER — Encounter: Payer: Self-pay | Admitting: Family Medicine

## 2023-04-17 VITALS — BP 120/72 | HR 99 | Ht 64.0 in | Wt 141.2 lb

## 2023-04-17 DIAGNOSIS — S46011D Strain of muscle(s) and tendon(s) of the rotator cuff of right shoulder, subsequent encounter: Secondary | ICD-10-CM

## 2023-04-17 DIAGNOSIS — Z1211 Encounter for screening for malignant neoplasm of colon: Secondary | ICD-10-CM | POA: Diagnosis not present

## 2023-04-17 NOTE — Patient Instructions (Signed)
 It was wonderful to see you today!  Glad to hear that your shoulder is feeling better.  You can continue to take meloxicam for the pain as needed.  If your pain gets worse or you need to be seen again please do not hesitate to reach out.  Otherwise I will see you as needed until your next yearly physical.  Please call [phone] with any questions about today's appointment.   If you need any additional refills, please call your pharmacy before calling the office.  Gerrit Heck, DO Family Medicine

## 2023-04-17 NOTE — Progress Notes (Signed)
    SUBJECTIVE:   CHIEF COMPLAINT / HPI:   Shoulder pain follow up. Also needs colonoscopy ordered Saw podiatry, will have lesions removed with them. Plan was to refer to sports med if shoulder pain not improved by this visit  Today her shoulder pain is vastly improved.  She reports that she is no longer having difficulty with lifting objects related to her weight and the medication that I gave her at her last visit has been helping significantly.  She has been able to reduce the amount of times in a week she even needs to take it.  PERTINENT  PMH / PSH: Shoulder pain, verrucous lesions of the bilateral feet  OBJECTIVE:   BP 120/72   Pulse 99   Ht 5\' 4"  (1.626 m)   Wt 141 lb 3.2 oz (64 kg)   SpO2 95%   BMI 24.24 kg/m   General: A&O, NAD HEENT: No sign of trauma, EOM grossly intact Cardiac: RRR, no m/r/g Respiratory: CTAB, normal WOB, no w/c/r Extremities: NTTP, no peripheral edema.  ASSESSMENT/PLAN:   Strain of rotator cuff Improved greatly with use of meloxicam and strengthening exercises.  Will defer referral to sports medicine at this time.  Advised patient that if she needed to be seen again she could call at any time otherwise we will follow-up with yearly physical.    Gerrit Heck, DO [facility]

## 2023-04-17 NOTE — Assessment & Plan Note (Signed)
 Improved greatly with use of meloxicam and strengthening exercises.  Will defer referral to sports medicine at this time.  Advised patient that if she needed to be seen again she could call at any time otherwise we will follow-up with yearly physical.

## 2023-04-18 ENCOUNTER — Encounter: Payer: Self-pay | Admitting: Pharmacist

## 2023-04-18 ENCOUNTER — Ambulatory Visit (INDEPENDENT_AMBULATORY_CARE_PROVIDER_SITE_OTHER): Payer: Medicaid Other | Admitting: Pharmacist

## 2023-04-18 VITALS — BP 105/60 | HR 88 | Wt 139.0 lb

## 2023-04-18 DIAGNOSIS — F17200 Nicotine dependence, unspecified, uncomplicated: Secondary | ICD-10-CM

## 2023-04-18 NOTE — Patient Instructions (Signed)
 Nice to see you today!  You are making good progress.   Medication Changes: Continue Nicotine Gum 2 mg - 5-7 pieces per day.   Take Nortriptyline 25mg  or 50 (two tablets) at bedtime or slightly before bedtime to improve sleep and help with restful sleep.   Goal for next visit is to reduce intake from 7 cigarettes to lower.  Focus on driving and Last cigarette of the day. - 8:00 PM  Continue all other medication the same.    Tobacco Patient Instructions  Quitting smoking is one of the most important decisions you can make for your current and future health. Consider what you dislike about smoking and how quitting could personally benefit you. Try to cut down.   Starting today, Be a Quitter!  Remind yourself why you want to quit.  Delay your first cigarette of the day for as long as possible.  Start cleaning out all pockets, drawers, and your car of cigarettes.  Getting Through the Cravings Once You Are Smoke Free: Each craving will last about 10 minutes, whether or not you smoke. Here's how to get through the cravings without cigarettes:  DELAY: Tell yourself that you'll wait for the next craving. Do it every time! DEEP BREATHS: One reason smoking feels good is because you breathe in deeply to inhale. Take four slow, deep breaths and feel the relaxation without the hamful effects of cigarettes. DRINK WATER: Drink a glass of cool water. It will give your hands and mouth something to do and will help flush the nicotine out of your system faster. DIVERT: Do something else -- brush your teeth, take a walk, call a friend who can offer you support. Just moving onto something other than thinking about cigarettes will move you through the craving.   Frequently Asked Questions  What can I do when I get the urge to smoke? To get through the urge to smoke, try the following:  Review your reasons for quitting and think of all the benefits to your health, your finances, and your family.   Remind yourself that there is no such thing as just one cigarette -- or even one puff.  Ride out the desire to smoke. Use the 4 Os -- Delay, Deep Breaths, Drink Water and Divert to get you through. The craving will go away eventually. Do not fool yourself into thinking you can have just one cigarette.  Any tips on how to deal with stress? Stress is a natural part of life. The key is to deal with it without reaching for a cigarette. Taking deep breaths, counting backwards from 10 and asking yourself 1-how big a deal is this?"  Writing down your feelings, talking with a friend and doing things like positive self-talk and meditation are some other ways that people deal with daily stress.  What if I start smoking again? Slips happen. Most people try to quit smoking a few times before they are successful. Don't beat yourself up if this happens to you! Ask yourself if this was a slip or a relapse. A slip is a one-time mistake that is quickly corrected. A relapse is going back to your old smoking habits.   If you slip, don't give up. Think of it as a learning experience. Ask yourself what went wrong and renew your commitment to staying away from smoking for good.  If you relapse, try not to get discouraged. Ask yourself the question "What caused me to start smoking?" Figure out what helped you and what  didn't when you tried to quit. Knowing why you relapsed is useful information for your next attempt to quit.

## 2023-04-18 NOTE — Progress Notes (Signed)
   S:   Chief Complaint  Patient presents with   Medication Management    Tobacco Intake Reduction   58 y.o. female who presents for evaluation/assistance with tobacco dependence.  PMH is significant for tobacco use disorder.    Patient was referred and last seen by Primary Care Provider, Dr. Samara Deist, on 04/16/2023.    At last visit, plan was to decrease number of cigarettes from ~ 7 per day to 5 cigarettes per day with use of NRT.    Brand smoked Newports. Currently Number of cigarettes/day remains ~ 7.    Medications used in past cessation efforts include: NRT   Rates CONFIDENCE of reducing intake of tobacco to < 5 per day on 1-10 scale of 7.   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  All other systems reviewed and are negative.   Physical Exam Vitals reviewed.  Constitutional:      Appearance: Normal appearance. She is normal weight.  Pulmonary:     Effort: Pulmonary effort is normal.  Neurological:     Mental Status: She is alert.  Psychiatric:        Mood and Affect: Mood normal.        Behavior: Behavior normal.        Thought Content: Thought content normal.     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  She has made minimal progress with reducing intake however has tried nicotine gum and following reeducation on use is willing to continue use with attempt to reduce cigarette use.  -Continued nicotine replacement tx with Nicorette 2 mg gum. Patient reeducated on purpose, proper use, and potential adverse effects - GI upset. -Set goal with patient to decrease number of cigarettes from 7 per day to <5 cigarettes per day with use of NRT -Discussed target intake reduction of last cigarette of the day AND potentially decreasing smoking when in the car.   Written patient instructions provided. Patient verbalized understanding of treatment plan.  Total time in face to face counseling 26 minutes.    Follow-up:  Pharmacist 05/16/2023 PCP clinic visit TBD Patient seen with Threasa Heads, PharmD Candidate and Mack Guise, PharmD Candidate.

## 2023-04-18 NOTE — Assessment & Plan Note (Signed)
 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  She has made minimal progress with reducing intake however has tried nicotine gum and following reeducation on use is willing to continue use with attempt to reduce cigarette use.  -Continued nicotine replacement tx with Nicorette 2 mg gum. Patient reeducated on purpose, proper use, and potential adverse effects - GI upset. -Set goal with patient to decrease number of cigarettes from 7 per day to <5 cigarettes per day with use of NRT -Discussed target intake reduction of last cigarette of the day AND potentially decreasing smoking when in the car.

## 2023-04-19 NOTE — Progress Notes (Signed)
 Reviewed and agree with Dr Macky Lower plan.

## 2023-04-20 ENCOUNTER — Encounter: Payer: Self-pay | Admitting: Podiatry

## 2023-04-20 ENCOUNTER — Ambulatory Visit (INDEPENDENT_AMBULATORY_CARE_PROVIDER_SITE_OTHER): Payer: Medicaid Other | Admitting: Podiatry

## 2023-04-20 DIAGNOSIS — D492 Neoplasm of unspecified behavior of bone, soft tissue, and skin: Secondary | ICD-10-CM

## 2023-04-20 NOTE — Progress Notes (Signed)
 She presents today chief complaint of painful calluses bilaterally.  Objective: Vitals are stable alert oriented x 3.  Pulses are palpable.  Multiple benign porokeratotic lesions plantar aspect of the bilateral foot no open lesions or wounds.  She has lesions to the hallux second digit forefoot midfoot rear foot plantarly.  Assessment: Porokeratosis benign skin lesions bilateral foot.  Plan: Greater than 20 benign skin lesions were trimmed today with 312 blade.  Padding was placed.

## 2023-05-02 ENCOUNTER — Ambulatory Visit (INDEPENDENT_AMBULATORY_CARE_PROVIDER_SITE_OTHER): Admitting: Student

## 2023-05-02 VITALS — BP 104/64 | HR 104 | Temp 98.5°F | Ht 64.0 in | Wt 137.6 lb

## 2023-05-02 DIAGNOSIS — R112 Nausea with vomiting, unspecified: Secondary | ICD-10-CM

## 2023-05-02 MED ORDER — ONDANSETRON 4 MG PO TBDP: 4.0000 mg | ORAL_TABLET | Freq: Three times a day (TID) | ORAL | 0 refills | Status: DC | PRN

## 2023-05-02 NOTE — Patient Instructions (Signed)
 I am prescribing medication for nausea and vomiting called Zofran. You should take this as needed every 4-8 hours.   I am checking blood work to make sure your electrolytes look okay.

## 2023-05-02 NOTE — Progress Notes (Signed)
    SUBJECTIVE:   CHIEF COMPLAINT / HPI:   Jennene Jennifer Costa is a 58 y.o. female  presenting for multiple days of nausea and vomiting.  Patient reported symptoms started Sunday.  She began having multiple bouts of vomiting.  She has been unable to keep anything down.  She reported to eat 1 ginger ale and a pizza slice yesterday and was able to keep that down.  She has been having multiple bouts of diarrhea.  She denies hematemesis, hematochezia, or melanotic stool.  She does not know of any sick contacts although she does live with her son and grandson.  PERTINENT  PMH / PSH: Reviewed and updated   OBJECTIVE:   BP 104/64   Pulse (!) 104   Temp 98.5 F (36.9 C)   Ht 5\' 4"  (1.626 m)   Wt 137 lb 9.6 oz (62.4 kg)   SpO2 99%   BMI 23.62 kg/m   Uncomfortable-appearing, no acute distress Cardio: Regular rate, regular rhythm, no murmurs on exam. Pulm: Clear, no wheezing, no crackles. No increased work of breathing Abdominal: bowel sounds present, soft, non-tender, non-distended Extremities: no peripheral edema       05/02/2023   10:30 AM 07/22/2021    2:05 PM 04/26/2021   10:59 AM  PHQ9 SCORE ONLY  PHQ-9 Total Score 0 5 9      ASSESSMENT/PLAN:   Nausea and vomiting: Most likely viral mediated gastritis.  Will prescribe Zofran to be used as needed for nausea and vomiting.  Discussed hydration strategies with patient.  Provided work note.  Will also check BMP today to check electrolytes.  Glendale Chard, DO [facility]

## 2023-05-03 ENCOUNTER — Encounter: Payer: Self-pay | Admitting: Student

## 2023-05-03 LAB — BASIC METABOLIC PANEL
BUN/Creatinine Ratio: 12 (ref 9–23)
BUN: 10 mg/dL (ref 6–24)
CO2: 21 mmol/L (ref 20–29)
Calcium: 10.1 mg/dL (ref 8.7–10.2)
Chloride: 99 mmol/L (ref 96–106)
Creatinine, Ser: 0.83 mg/dL (ref 0.57–1.00)
Glucose: 111 mg/dL — ABNORMAL HIGH (ref 70–99)
Potassium: 4.8 mmol/L (ref 3.5–5.2)
Sodium: 137 mmol/L (ref 134–144)
eGFR: 81 mL/min/{1.73_m2} (ref >59)

## 2023-05-16 ENCOUNTER — Ambulatory Visit: Admitting: Pharmacist

## 2023-05-17 ENCOUNTER — Ambulatory Visit: Admitting: Pharmacist

## 2023-05-27 DIAGNOSIS — Z419 Encounter for procedure for purposes other than remedying health state, unspecified: Secondary | ICD-10-CM | POA: Diagnosis not present

## 2023-06-01 ENCOUNTER — Telehealth: Payer: Self-pay | Admitting: Pharmacist

## 2023-06-01 NOTE — Telephone Encounter (Signed)
 Duplicate/error

## 2023-06-01 NOTE — Telephone Encounter (Signed)
 Patient contacted for follow-up of missed appointment for tobacco cessation. Patient rescheduled for 4/23.  Since last contact patient reports no change in tobacco use since last appointment.  Current Medications include: nicotine gum 2 mg  Medication Plan: -no change    Total time with patient call and documentation of interaction: 4 minutes.

## 2023-06-06 ENCOUNTER — Ambulatory Visit (INDEPENDENT_AMBULATORY_CARE_PROVIDER_SITE_OTHER): Admitting: Student

## 2023-06-06 VITALS — BP 105/81 | HR 92 | Ht 64.0 in | Wt 134.4 lb

## 2023-06-06 DIAGNOSIS — M25511 Pain in right shoulder: Secondary | ICD-10-CM | POA: Diagnosis not present

## 2023-06-06 DIAGNOSIS — G5601 Carpal tunnel syndrome, right upper limb: Secondary | ICD-10-CM

## 2023-06-06 MED ORDER — DICLOFENAC SODIUM 1 % EX GEL
2.0000 g | Freq: Four times a day (QID) | CUTANEOUS | 2 refills | Status: DC
Start: 2023-06-06 — End: 2023-07-19

## 2023-06-06 MED ORDER — MELOXICAM 15 MG PO TABS: 15.0000 mg | ORAL_TABLET | Freq: Every day | ORAL | 0 refills | Status: AC

## 2023-06-06 NOTE — Progress Notes (Signed)
    SUBJECTIVE:   CHIEF COMPLAINT / HPI:   58 year old female presents with numbness and tingling in their right wrist and hand, which has been ongoing for an unspecified duration. They also report similar symptoms in their shoulder. The numbness and tingling are particularly noticeable when they sleep on either side, causing both arms to go numb. They also experience these symptoms when they keep their hand in a certain position for about five to ten minutes. The patient denies any recent trauma to the hand. 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   In addition she is experiencing shoulder pain since last year and have been seeing another doctor for this issue. They have had x-rays of their arm, but not their shoulder.  PERTINENT  PMH / PSH: reviewed   OBJECTIVE:   BP 105/81   Pulse 92   Ht 5\' 4"  (1.626 m)   Wt 134 lb 6.4 oz (61 kg)   SpO2 99%   BMI 23.07 kg/m    Physical Exam General: Alert, well appearing, NAD Cardiovascular: Regular rhythm, well-perfused Respiratory: Normal WOB on room air Right Shoulder: No notable deformity,  mild tenderness over the anterior right shoulder. Normal abduction and adduction.  Right wrist: Positive Tinel test, Negative phalen. Tenderness over the dora. 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 Done well in the past with brace. Tingling and numbness in fingers consistent with carpal tunnel syndrome, likely due to repetitive wrist movements at work. - Advise purchase and use of a thumb spica brace, especially during sleep. - Educated on the importance of wearing the brace to alleviate numbness. - Rx meloxicam  and Voltaren  gel.   Shoulder pain Chronic shoulder pain with swelling during lifting, likely due to inflammation or structural issues. - Order shoulder  x-ray. - Prescribe anti-inflammatory medication once daily for 14 days. - Prescribe Voltaren  gel for topical application.   Goble Last, MD [facility]

## 2023-06-06 NOTE — Patient Instructions (Addendum)
 Pleasure to meet you today.  Suspect your right shoulder could be due to strain of your shoulder joint likely from your work.  However I have ordered an x-ray for your right shoulder.  You can go to [address] to have the x-ray completed.  Prescription for meloxicam  which you will take once daily for 14 days for your wrist and shoulder pain.  You can also use the Voltaren  gel 3/4 times a day over any joint hurting you.  I recommend getting a thumb spica brace which you will wear on your right wrist especially at night when you are going to sleep.  Please return in 3-4 weeks if no improvement.

## 2023-06-06 NOTE — Assessment & Plan Note (Signed)
 Chronic for patient and appears to have worsened in the last few weeks. Done well in the past with brace. Tingling and numbness in fingers consistent with carpal tunnel syndrome, likely due to repetitive wrist movements at work. - Advise purchase and use of a thumb spica brace, especially during sleep. - Educated on the importance of wearing the brace to alleviate numbness. - Rx meloxicam  and Voltaren  gel.

## 2023-06-07 ENCOUNTER — Ambulatory Visit: Admitting: Pharmacist

## 2023-06-07 ENCOUNTER — Encounter: Payer: Self-pay | Admitting: Pharmacist

## 2023-06-07 VITALS — BP 112/87 | HR 86 | Wt 134.8 lb

## 2023-06-07 DIAGNOSIS — F17200 Nicotine dependence, unspecified, uncomplicated: Secondary | ICD-10-CM

## 2023-06-07 NOTE — Progress Notes (Signed)
   S:   Chief Complaint  Patient presents with   Medication Management    Tobacco cessation   58 y.o. female who presents for evaluation/assistance with tobacco dependence.  PMH is significant for tobacco use disorder.   Patient was referred and last seen by Primary Care Provider, Dr. Mikeal Alder, on 06/06/23.   At last visit with Dr. Tacha Manni, patient's reported goal was to reduce from 7 to <5 cigarettes per day. Patient reports increased tobacco use up to 8-10 cigarettes per day due to ongoing stressors. She reports a variety of stressors and believes it will be weeks prior to some of the stressors to diminish or resolve.   Brand smoked Newports. Number of cigarettes/day 8-10.   Patient reports current shoulder pain as 3/10.  She denies picking-up/starting diclofenac  gel or meloxicam  which were prescribed yesterday.   Medications used in past cessation efforts include: NRT  Most common triggers to use tobacco include; driving, coworkers  O: Clinical ASCVD: No  The 10-year ASCVD risk score (Arnett DK, et al., 2019) is: 4.1%   Values used to calculate the score:     Age: 75 years     Sex: Female     Is Non-Hispanic African American: Yes     Diabetic: No     Tobacco smoker: Yes     Systolic Blood Pressure: 105 mmHg     Is BP treated: No     HDL Cholesterol: 69 mg/dL     Total Cholesterol: 182 mg/dL  Review of Systems  Constitutional:  Positive for malaise/fatigue.  Musculoskeletal:  Positive for joint pain (right).  Neurological:  Positive for tingling (right hand- fingers).  All other systems reviewed and are negative.   Physical Exam Vitals reviewed.  Constitutional:      Appearance: Normal appearance.  Pulmonary:     Effort: Pulmonary effort is normal.  Psychiatric:        Thought Content: Thought content normal.        Judgment: Judgment normal.    Patient is participating in a Managed Medicaid Plan:  Yes  A/P: Tobacco use disorder with moderate nicotine   dependence of 30 years duration in a patient who is fair candidate for success because of motivation to quit, although patient reports ongoing stressors that may continue for months. Patient reports non-adherence to nicotine  gum. -Goal: maintain current cigarette use (8-10 cigarettes per day) while handling with ongoing stressors -Encouraged continued efforts to reduce intake.  -Agreed to return visit in 6 weeks to discuss plan and progress.   Written patient instructions provided. Patient verbalized understanding of treatment plan.  Total time in face to face counseling 27 minutes.    Follow-up:  Pharmacist 07/12/23 PCP clinic visit 06/15/23 Patient seen with Teofilo Fellers, PharmD Candidate and Jeanine Millers, PharmD Candidate.

## 2023-06-07 NOTE — Assessment & Plan Note (Signed)
 Tobacco use disorder with moderate nicotine  dependence of 30 years duration in a patient who is fair candidate for success because of motivation to quit, although patient reports ongoing stressors that may continue for months. Patient reports non-adherence to nicotine  gum. -Goal: maintain current cigarette use (8-10 cigarettes per day) while handling with ongoing stressors -Encouraged continued efforts to reduce intake.  -Agreed to return visit in 6 weeks to discuss plan and progress.

## 2023-06-07 NOTE — Patient Instructions (Signed)
 Nice to see you today!  Medication Changes: Continue all other medication the same.   Tobacco Patient Instructions  Quitting smoking is one of the most important decisions you can make for your current and future health. Consider what you dislike about smoking and how quitting could personally benefit you. Try to cut down. Aim for maintaining the amount you smoke while handling ongoing stressors.  Starting today, Be a Quitter!  Remind yourself why you want to quit.  Delay your first cigarette of the day for as long as possible.  Start cleaning out all pockets, drawers, and your car of cigarettes.  Getting Through the Cravings Once You Are Smoke Free: Each craving will last about 10 minutes, whether or not you smoke. Here's how to get through the cravings without cigarettes:  DELAY: Tell yourself that you'll wait for the next craving. Do it every time! DEEP BREATHS: One reason smoking feels good is because you breathe in deeply to inhale. Take four slow, deep breaths and feel the relaxation without the hamful effects of cigarettes. DRINK WATER: Drink a glass of cool water. It will give your hands and mouth something to do and will help flush the nicotine  out of your system faster. DIVERT: Do something else -- brush your teeth, take a walk, call a friend who can offer you support. Just moving onto something other than thinking about cigarettes will move you through the craving.   Frequently Asked Questions  What can I do when I get the urge to smoke? To get through the urge to smoke, try the following:  Review your reasons for quitting and think of all the benefits to your health, your finances, and your family.  Remind yourself that there is no such thing as just one cigarette -- or even one puff.  Ride out the desire to smoke. Use the 4 Os -- Delay, Deep Breaths, Drink Water and Divert to get you through. The craving will go away eventually. Do not fool yourself into thinking you can  have just one cigarette.  Any tips on how to deal with stress? Stress is a natural part of life. The key is to deal with it without reaching for a cigarette. Taking deep breaths, counting backwards from 10 and asking yourself 1-how big a deal is this?"  Writing down your feelings, talking with a friend and doing things like positive self-talk and meditation are some other ways that people deal with daily stress.  What if I start smoking again? Slips happen. Most people try to quit smoking a few times before they are successful. Don't beat yourself up if this happens to you! Ask yourself if this was a slip or a relapse. A slip is a one-time mistake that is quickly corrected. A relapse is going back to your old smoking habits.   If you slip, don't give up. Think of it as a learning experience. Ask yourself what went wrong and renew your commitment to staying away from smoking for good.  If you relapse, try not to get discouraged. Ask yourself the question "What caused me to start smoking?" Figure out what helped you and what didn't when you tried to quit. Knowing why you relapsed is useful information for your next attempt to quit.

## 2023-06-08 NOTE — Progress Notes (Signed)
 Reviewed and agree with Dr Macky Lower plan.

## 2023-06-15 ENCOUNTER — Ambulatory Visit: Admitting: Family Medicine

## 2023-06-26 DIAGNOSIS — Z419 Encounter for procedure for purposes other than remedying health state, unspecified: Secondary | ICD-10-CM | POA: Diagnosis not present

## 2023-06-29 ENCOUNTER — Ambulatory Visit: Admitting: Family Medicine

## 2023-06-29 NOTE — Progress Notes (Deleted)
    SUBJECTIVE:   CHIEF COMPLAINT / HPI:   Stomach Issue  PERTINENT  PMH / PSH: ***  OBJECTIVE:   There were no vitals taken for this visit.  ***  ASSESSMENT/PLAN:   Assessment & Plan      Rayma Calandra, DO [facility]

## 2023-07-12 ENCOUNTER — Ambulatory Visit: Admitting: Pharmacist

## 2023-07-19 ENCOUNTER — Ambulatory Visit (INDEPENDENT_AMBULATORY_CARE_PROVIDER_SITE_OTHER): Admitting: Podiatry

## 2023-07-19 ENCOUNTER — Ambulatory Visit (INDEPENDENT_AMBULATORY_CARE_PROVIDER_SITE_OTHER)

## 2023-07-19 VITALS — Ht 64.0 in | Wt 134.0 lb

## 2023-07-19 DIAGNOSIS — M722 Plantar fascial fibromatosis: Secondary | ICD-10-CM | POA: Diagnosis not present

## 2023-07-20 NOTE — Addendum Note (Signed)
 Addended by: Brandt Cake on: 07/20/2023 10:13 AM   Modules accepted: Level of Service

## 2023-07-20 NOTE — Progress Notes (Addendum)
 Subjective:   Patient ID: Jennifer Costa, female   DOB: 58 y.o.   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  Patient is going to try to continue to be conservative I did debride courtesy lesions on both feet and will be seen back as needed  X-rays indicate severe rigid contracture of the lesser digits bilateral with chronic lesion formation

## 2023-07-27 DIAGNOSIS — Z419 Encounter for procedure for purposes other than remedying health state, unspecified: Secondary | ICD-10-CM | POA: Diagnosis not present

## 2023-08-01 ENCOUNTER — Encounter: Payer: Self-pay | Admitting: Pharmacist

## 2023-08-26 DIAGNOSIS — Z419 Encounter for procedure for purposes other than remedying health state, unspecified: Secondary | ICD-10-CM | POA: Diagnosis not present

## 2023-09-25 ENCOUNTER — Ambulatory Visit (INDEPENDENT_AMBULATORY_CARE_PROVIDER_SITE_OTHER): Admitting: Student

## 2023-09-25 VITALS — BP 99/75 | HR 74 | Ht 64.0 in | Wt 138.8 lb

## 2023-09-25 DIAGNOSIS — M778 Other enthesopathies, not elsewhere classified: Secondary | ICD-10-CM

## 2023-09-25 MED ORDER — MELOXICAM 7.5 MG PO TABS: 7.5000 mg | ORAL_TABLET | Freq: Every day | ORAL | 0 refills | Status: DC

## 2023-09-25 NOTE — Progress Notes (Cosign Needed Addendum)
    SUBJECTIVE:   CHIEF COMPLAINT / HPI:   The patient presents with foot and arm pain.  Severe pain is present in the bottom of their feet, with painful growths that are not calluses. The pain has worsened, making it difficult to wear shoes or go to work. Steel-toed shoes exacerbate the pain.  Pain and swelling are present in their arm, believed to be due to inflammation. The pain is localized to the bone and has been recurring over the past year, with periods of exacerbation and relief. 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  PERTINENT  PMH / PSH: Reviewed   OBJECTIVE:   BP 99/75   Wt 138 lb 12.8 oz (63 kg)   SpO2 100%   BMI 23.82 kg/m     Physical Exam General: Alert, well appearing, NAD Cardiovascular: RRR, No Murmurs, Normal S2/S2 Respiratory: Normal work of breathing on RA Right upper extremity: No notable deformity, edema or erythema of the right elbow. 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 Genetic component suspected, no definitive cure. - Recommend follow up with podiatrist for callus management and potential shaving. - Advise changing insoles to reduce recurrence. - Instruct to contact podiatrist for earlier appointment due to pain.  Chronic right arm pain Chronic right arm pain likely from overuse and tendinitis, worsened by work activities. Symptoms include aching and intermittent swelling, persisting for a year. - Prescribe meloxicam  for inflammation and pain relief. - Provided to employer for potential work modifications to reduce arm strain. - Discussed importance of work modifications to prevent symptom exacerbation.  Norleen April, MD [facility]

## 2023-09-25 NOTE — Patient Instructions (Addendum)
 Pleasure to meet you today.  For the callus in your feet I recommend seeing your podiatrist.  I also believe you will benefit from having an insoles in your shoe to reduce the risk of reoccurrence  The right elbow pain you are having I suspect he has an overuse tendonitis.I have sent in prescription for meloxicam  to help reduce some of the inflammation is in pain that you are having.  I recommend using Tylenol  30 minutes before the start of shift and within an hour after your shift to help reduce the pain.

## 2023-09-26 DIAGNOSIS — Z419 Encounter for procedure for purposes other than remedying health state, unspecified: Secondary | ICD-10-CM | POA: Diagnosis not present

## 2023-10-23 ENCOUNTER — Encounter: Payer: Self-pay | Admitting: Podiatry

## 2023-10-23 ENCOUNTER — Ambulatory Visit (INDEPENDENT_AMBULATORY_CARE_PROVIDER_SITE_OTHER): Admitting: Podiatry

## 2023-10-23 VITALS — Ht 64.0 in | Wt 138.0 lb

## 2023-10-23 DIAGNOSIS — M7751 Other enthesopathy of right foot: Secondary | ICD-10-CM

## 2023-10-23 MED ORDER — GABAPENTIN 300 MG PO CAPS: 300.0000 mg | ORAL_CAPSULE | Freq: Three times a day (TID) | ORAL | 3 refills | Status: DC

## 2023-10-24 NOTE — Progress Notes (Signed)
 Subjective:   Patient ID: Jennifer Costa, female   DOB: 58 y.o.   MRN: [id number]   HPI Patient states she is getting pain in both feet making it difficult to stand walker with foot wear it seems like it comes back every couple months and the feet are burning really bad at the current time  neurovascular status was found to be intact with diminishment of sharp dull vibratory noted upon evaluation.  Patient's muscle strength is adequate   ROS      Objective:  Physical Exam  Range of motion moderately reduced with patient found to have diffuse burning pain but no areas of direct palpation pain currently     Assessment:  Probability that this may be neuropathic in its nature versus inflammatory     Plan:  H&P reviewed and at this point I have recommended gabapentin  and explained gabapentin  to patient to try to reduce symptoms present.  Patient wants to undergo this understanding risk and gabapentin  will start 1 at night can add 1 midday and morning as needed

## 2023-10-27 DIAGNOSIS — Z419 Encounter for procedure for purposes other than remedying health state, unspecified: Secondary | ICD-10-CM | POA: Diagnosis not present

## 2023-11-13 ENCOUNTER — Ambulatory Visit

## 2023-11-13 VITALS — BP 108/74 | HR 94 | Temp 98.7°F | Ht 64.0 in | Wt 132.6 lb

## 2023-11-13 DIAGNOSIS — R112 Nausea with vomiting, unspecified: Secondary | ICD-10-CM

## 2023-11-13 LAB — POCT URINALYSIS DIP (MANUAL ENTRY)
Bilirubin, UA: NEGATIVE
Glucose, UA: NEGATIVE mg/dL
Ketones, POC UA: NEGATIVE mg/dL
Leukocytes, UA: NEGATIVE
Nitrite, UA: NEGATIVE
Protein Ur, POC: NEGATIVE mg/dL
Spec Grav, UA: 1.025 (ref 1.010–1.025)
Urobilinogen, UA: 0.2 U/dL
pH, UA: 6 (ref 5.0–8.0)

## 2023-11-13 MED ORDER — DICYCLOMINE HCL 10 MG PO CAPS: 10.0000 mg | ORAL_CAPSULE | Freq: Three times a day (TID) | ORAL | Status: AC

## 2023-11-13 MED ORDER — ONDANSETRON 4 MG PO TBDP: 4.0000 mg | ORAL_TABLET | Freq: Three times a day (TID) | ORAL | 0 refills | Status: AC | PRN

## 2023-11-13 NOTE — Progress Notes (Signed)
    SUBJECTIVE:   CHIEF COMPLAINT / HPI:   Patient presents today after 2 weeks of nausea vomiting and diarrhea.  She has been attempting to manage it with Alka-Seltzer and Pepto-Bismol at home without much benefit.  She reports waking up in the morning each day and vomiting, denies waking up in the middle of the night to throw up or by this abdominal pain.  Her vomit is nonbloody and nonbilious. She has 3-4 episodes of diarrhea that she describes as fast, liquid stools per day, denies any blood in the stools.  She endorses left lower quadrant pain that feels like twisting.  She endorses 1 episode of hematuria 3 weeks ago before the onset of the symptoms but denies dysuria or other episodes of hematuria in the past or since.  She is no longer menstruating and denies any vaginal bleeding.  She has only managed to eat about 1 full meal per day given the abdominal pain and nausea, and cannot tolerate drinking plain water.  She has mostly been drinking Ting, sweet tea and milk.  She denies significant gas and reports that she has been drinking lots of milk for her whole life without issue.  She tolerated eating some oranges at work the other day pretty well.  She reports a few previous episodes of similar symptoms that were largely treated symptomatically and resolved over time.   OBJECTIVE:   BP 108/74   Pulse 94   Temp 98.7 F (37.1 C)   Ht 5' 4 (1.626 m)   Wt 132 lb 9.6 oz (60.1 kg)   SpO2 99%   BMI 22.76 kg/m   General: Awake, alert, in moderate distress from nausea. Communicates clearly. HEENT: NCAT. PERRLA EOMI. Anicteric sclera, conjunctiva clear. MMM.  Cardio: RRR. Normal S1, S2. No murmur, rub, gallop. 2+ radial and dorsalis pedis pulses b/l w/ good capillary refill.  Resp: CTA bilaterally. No wheezes, rales, or rhonchi. Normal work of breathing on room air.  Abdomen: soft w/ tenderness to deep palpation in LLQ and LUQ. Mild guarding diffusely on exam. No HSM. Normoactive BS  auscultated. No  rigidity, or rebound. Negative Murphy's. No tenderness at McBurney's point. Negative CVA tenderness.   ASSESSMENT/PLAN:   Assessment & Plan Nausea and vomiting, unspecified vomiting type Patient presents with repeat episode of 2 weeks of nausea vomiting and diarrhea.  Documentation of multiple prior episodes with a similar description.  No hematemesis, hematochezia or melena.  Differential includes but is not limited to viral gastroenteritis, IBS-D, undetected GI malignancy, lactose intolerance or celiac disease. Point-of-care UA in the office today Return for lab visit tomorrow for CBC, CMP, TSH Outpatient CT abdomen pelvis without contrast Referred to GI for further evaluation of these episodes Zofran  4 mg Q8 as needed Bentyl 10 mg before meals 3 times a day Revisit colon cancer screening in the future     Jennifer Scriver, DO [facility]

## 2023-11-13 NOTE — Patient Instructions (Signed)
 It was great to see you today!  Today we talked about your nausea, vomiting and diarrhea.  There are a number of different things that could be causing these symptoms, so we are going to do some additional testing as well as giving you some medicines to hopefully help with your symptoms.  We are going to set you up with a lab visit to get some blood work done in the next couple of days.  We are going to have you get a CT scan of your abdomen given how long these episodes have been happening for you.  We are also going to send in a referral for you to see a stomach and digestion doctor, call the gastroenterologist.   I am sending in 2 medications for you.  The first 1 is called Zofran , or ondansetron .  This is a medicine to help with the symptoms of nausea and vomiting that you can take every 8 hours as needed.  The second medicine is called Bentyl or dicyclomine.  You can take this medicine 3 times a day, always before meals, to help with digestion.  Please remember to come in for your lab visit and your follow up visit with Dr Sheppard Pinal.   Thank you for choosing [facility].   Please call [phone] with any questions about today's appointment.  Leafy Scriver, DO Family Medicine

## 2023-11-14 LAB — URINALYSIS, MICROSCOPIC ONLY
Bacteria, UA: NONE SEEN
Casts: NONE SEEN /LPF
WBC, UA: NONE SEEN /HPF (ref 0–5)

## 2023-11-16 ENCOUNTER — Other Ambulatory Visit

## 2023-11-20 ENCOUNTER — Other Ambulatory Visit

## 2023-11-26 DIAGNOSIS — Z419 Encounter for procedure for purposes other than remedying health state, unspecified: Secondary | ICD-10-CM | POA: Diagnosis not present

## 2023-12-04 ENCOUNTER — Ambulatory Visit: Admitting: Family Medicine

## 2023-12-04 ENCOUNTER — Encounter: Payer: Self-pay | Admitting: Family Medicine

## 2023-12-04 ENCOUNTER — Ambulatory Visit: Payer: Self-pay | Admitting: Family Medicine

## 2023-12-04 VITALS — BP 123/81 | HR 94 | Ht 64.0 in | Wt 134.4 lb

## 2023-12-04 DIAGNOSIS — R1115 Cyclical vomiting syndrome unrelated to migraine: Secondary | ICD-10-CM | POA: Diagnosis not present

## 2023-12-04 NOTE — Progress Notes (Signed)
    SUBJECTIVE:   CHIEF COMPLAINT / HPI:   N/V for years Nausea every day, vomiting was previously daily but now intermittent. No clear trigger. Vomiting sometimes triggered by coughing.  No dysphagia. No regurgitation. Drinking water occasionally causes abdominal cramps. No specific food triggers symptoms.   Smokes cigarettes, occasional alcohol, no marijuana No fhx colon cancer Has not had colonoscopy   Seen for same 10/2023, rx bentyl and zofran  with no improvement Has not heard about scheduling CT A/P or GI referral No abdominal pain. No diarrhea.   PERTINENT  PMH / PSH: COPD, depression, tobacco use  OBJECTIVE:   BP 123/81   Pulse 94   Ht 5' 4 (1.626 m)   Wt 134 lb 6.4 oz (61 kg)   SpO2 99%   BMI 23.07 kg/m   General: well appearing, NAD Cardiovascular: RRR, no m/r/g Respiratory: normal work of breathing on RA, CTAB Abdomen: Normal bowel sounds, soft, non-tender  ASSESSMENT/PLAN:   Assessment & Plan Persistent vomiting Has been seen multiple times for same unfortunately, unclear etiology. Benign abdomen. Will check labs today including celiac panel, tsh, lipase, cbc, cmp.  Provided number to call regarding GI referral and CT A/P F/u with PCP     Elyce Prescott, DO [facility]

## 2023-12-04 NOTE — Patient Instructions (Addendum)
 Good to see you today - Thank you for coming in  Things we discussed today:  We are getting some labwork today. I will let you know if it is abnormal. Please call GI to schedule your appointment.  Please call [facility] about your CT scan.  [phone]) [phone]  Referral sent to: [facility] [address] [place] [address], [place], [place] [address] Phone: [phone]

## 2023-12-05 LAB — TSH RFX ON ABNORMAL TO FREE T4: TSH: 1.57 u[IU]/mL (ref 0.450–4.500)

## 2023-12-06 ENCOUNTER — Ambulatory Visit: Payer: Self-pay | Admitting: Family Medicine

## 2023-12-10 LAB — CBC WITH DIFFERENTIAL/PLATELET
Basophils Absolute: 0.1 x10E3/uL (ref 0.0–0.2)
Basos: 1 %
EOS (ABSOLUTE): 0.5 x10E3/uL — ABNORMAL HIGH (ref 0.0–0.4)
Eos: 5 %
Hematocrit: 45.9 % (ref 34.0–46.6)
Hemoglobin: 15 g/dL (ref 11.1–15.9)
Immature Grans (Abs): 0 x10E3/uL (ref 0.0–0.1)
Immature Granulocytes: 0 %
Lymphocytes Absolute: 3.7 x10E3/uL — ABNORMAL HIGH (ref 0.7–3.1)
Lymphs: 37 %
MCH: 32.1 pg (ref 26.6–33.0)
MCHC: 32.7 g/dL (ref 31.5–35.7)
MCV: 98 fL — ABNORMAL HIGH (ref 79–97)
Monocytes Absolute: 0.7 x10E3/uL (ref 0.1–0.9)
Monocytes: 7 %
Neutrophils Absolute: 5.1 x10E3/uL (ref 1.4–7.0)
Neutrophils: 50 %
Platelets: 377 x10E3/uL (ref 150–450)
RBC: 4.68 x10E6/uL (ref 3.77–5.28)
RDW: 13.1 % (ref 11.7–15.4)
WBC: 10 x10E3/uL (ref 3.4–10.8)

## 2023-12-10 LAB — COMPREHENSIVE METABOLIC PANEL WITH GFR
ALT: 22 IU/L (ref 0–32)
AST: 31 IU/L (ref 0–40)
Albumin: 4.4 g/dL (ref 3.8–4.9)
Alkaline Phosphatase: 65 IU/L (ref 49–135)
BUN/Creatinine Ratio: 11 (ref 9–23)
BUN: 10 mg/dL (ref 6–24)
Bilirubin Total: 0.5 mg/dL (ref 0.0–1.2)
CO2: 23 mmol/L (ref 20–29)
Calcium: 9.7 mg/dL (ref 8.7–10.2)
Chloride: 101 mmol/L (ref 96–106)
Creatinine, Ser: 0.88 mg/dL (ref 0.57–1.00)
Globulin, Total: 2.8 g/dL (ref 1.5–4.5)
Glucose: 73 mg/dL (ref 70–99)
Potassium: 4.4 mmol/L (ref 3.5–5.2)
Sodium: 140 mmol/L (ref 134–144)
Total Protein: 7.2 g/dL (ref 6.0–8.5)
eGFR: 76 mL/min/1.73 (ref >59)

## 2023-12-10 LAB — CELIAC DISEASE COMPREHENSIVE PANEL WITH REFLEXES
IgA/Immunoglobulin A, Serum: 231 mg/dL (ref 87–352)
Transglutaminase IgA: 3 U/mL (ref 0–3)

## 2023-12-10 LAB — LIPASE: Lipase: 29 U/L (ref 14–72)

## 2024-01-03 ENCOUNTER — Ambulatory Visit: Admitting: Podiatry

## 2024-01-03 ENCOUNTER — Encounter: Payer: Self-pay | Admitting: Podiatry

## 2024-01-03 VITALS — Ht 64.0 in | Wt 134.0 lb

## 2024-01-03 DIAGNOSIS — M216X1 Other acquired deformities of right foot: Secondary | ICD-10-CM | POA: Diagnosis not present

## 2024-01-03 DIAGNOSIS — M216X2 Other acquired deformities of left foot: Secondary | ICD-10-CM | POA: Diagnosis not present

## 2024-01-04 NOTE — Progress Notes (Signed)
 Subjective:   Patient ID: Jennifer Costa, female   DOB: 58 y.o.   MRN: [id number]   HPI Patient presents chronic foot pain bilateral with lesions that also form with significant foot structural issues both feet with significant flatfoot deformity and failure to respond to conservative aggressive treatment   ROS      Objective:  Physical Exam  Severe flatfoot deformity bilateral acquired over the years along with chronic keratotic lesion bilateral painful     Assessment:  Structural deformity with reduction of arch height bilateral and chronic lesions bilateral     Plan:  H&P reviewed great length at this point I did do courtesy debridement of lesions and I casted for accommodative orthotics to try to offload weight.  Patient will be seen back to recheck

## 2024-01-15 ENCOUNTER — Encounter: Payer: Self-pay | Admitting: Family Medicine

## 2024-01-15 ENCOUNTER — Ambulatory Visit: Admitting: Family Medicine

## 2024-01-15 VITALS — BP 114/80 | HR 96 | Ht 64.0 in | Wt 133.6 lb

## 2024-01-15 DIAGNOSIS — M1612 Unilateral primary osteoarthritis, left hip: Secondary | ICD-10-CM

## 2024-01-15 DIAGNOSIS — K4190 Unilateral femoral hernia, without obstruction or gangrene, not specified as recurrent: Secondary | ICD-10-CM | POA: Diagnosis not present

## 2024-01-15 NOTE — Patient Instructions (Addendum)
 It was wonderful to see you today!  For your hip pain, I think there are two things going on: Arthritis in your hip. Keep taking the meloxicam  daily to help with the pain, and you will follow up with sports medicine to discuss other therapies Inguinal/Femoral hernia- I have ordered a CT Scan, our office will call you with the date and time in the next 1-2 weeks. I have also placed a referral to general surgery to discuss options to fix the hernia.  Please call [phone] with any questions about today's appointment.   If you need any additional refills, please call your pharmacy before calling the office.  Lucie Pinal, DO Family Medicine

## 2024-01-15 NOTE — Progress Notes (Signed)
    SUBJECTIVE:   CHIEF COMPLAINT / HPI:   L side pain- started on Sunday the 21st while working. Located in the L groin. Says it got better with meloxicam  for the first few days, then on Thursday she was coughing and she felt a gripping and twisting that took at least 30 minutes to resolve. She has experienced this several more times since Thursday, and called out of work yesterday because she was afraid to lift anything.   PERTINENT  PMH / PSH: Osteoarthritis  OBJECTIVE:   BP 114/80   Pulse 96   Ht 5' 4 (1.626 m)   Wt 133 lb 9.6 oz (60.6 kg)   SpO2 100%   BMI 22.93 kg/m   Abdominal: Soft, non-tender, nondistended. Normal bowel sounds. 1cm ring felt near inguinal/femoral canal on left, bulging present with valsalva, not present on the right MSK: Full ROM bilaterally. FABER positive on the left. FADIR neg bilaterally, Straight leg raise negative.   ASSESSMENT/PLAN:   Assessment & Plan Primary osteoarthritis of left hip - continue meloxicam  7.5 mg daily -tylenol  for breakthrough pain - assess for OA on CTAP as below -amb ref to sports medicine Unilateral femoral hernia without obstruction or gangrene, recurrence not specified -CTAP to assess for fascial defects - referral to general surgery - work restriction note provided in MyChart, patient may not lift anything heavier than 5lbs until assessed by gen surg or cleared by CT   Lucie Pinal, DO [facility]

## 2024-01-22 ENCOUNTER — Ambulatory Visit: Admitting: Family Medicine

## 2024-01-22 VITALS — BP 106/68 | Ht 64.0 in | Wt 135.0 lb

## 2024-01-22 DIAGNOSIS — M25552 Pain in left hip: Secondary | ICD-10-CM

## 2024-01-22 MED ORDER — TRAMADOL HCL 50 MG PO TABS: 50.0000 mg | ORAL_TABLET | Freq: Four times a day (QID) | ORAL | 0 refills | Status: AC | PRN

## 2024-01-22 NOTE — Progress Notes (Unsigned)
 PCP: Cleotilde Lukes, DO  Patient is a 58 y.o. female here for left inguinal/groin pain.  Left groin/LLQ pain Patient developed left inguinal/groin pain suddenly on 11/21. She then got worse with coughing and she felt a sort of twisting pain. The pain has been persistent, though mildly improved with rest. She indicates that the pain is anterior, deep, over the left hip, appears to be above inguinal ligament.  Patient was seen at family medicine on 12/1. Osteoarthritis was considered and hernia was felt, bulging with valsalva. Prescribed meloxicam  daily, recommended acetaminophen  for breakthrough pain. Referred to sports medicine.  Today, patient returns reporting persistent pain, currently 2/10. She has been resting and avoiding physical exertion, so feels a bit better. At worst, pain has been 8/10. Patient does not report any improvement with meloxicam . She notes that she sometimes bends forward while walking when the pain is severe and she feels better.  Denies N/V/D, dysuria, uterine/vaginal bleeding, fevers, chills.   Past Medical History:  Diagnosis Date   Arthritis    Strain of rotator cuff 03/09/2023   Unintentional weight loss 01/15/2020   Viral upper respiratory illness 02/21/2023    Current Outpatient Medications on File Prior to Visit  Medication Sig Dispense Refill   ondansetron  (ZOFRAN -ODT) 4 MG disintegrating tablet Take 1 tablet (4 mg total) by mouth every 8 (eight) hours as needed for nausea. 10 tablet 0   Current Facility-Administered Medications on File Prior to Visit  Medication Dose Route Frequency Provider Last Rate Last Admin   dicyclomine  (BENTYL ) capsule 10 mg  10 mg Oral TID AC         No past surgical history on file.  No Known Allergies  BP 106/68   Ht 5' 4 (1.626 m)   Wt 135 lb (61.2 kg)   BMI 23.17 kg/m       No data to display              No data to display              Objective:  Physical Exam:  Gen: NAD,  comfortable in exam room  Location: Left hip/groin - Inspection: no deformity, bruising. - Palpation: tenderness LLQ abdomen, over inferior obliques and lateral rectus.  No trochanter, ASIS, hip tenderness. - ROM: full range of motion without pain. - Strength: 5/5 hip flexion and extension - Special Tests: Negative straight leg raise, faber, fadir, stinchfield - Neurovascular: Intact distally   Assessment and Plan:   Left groin pain Overall hip exam is benign and reassuring.  Likely due to non-musculoskeletal cause given acute onset, location of pain, and reassuring exam.  Discussed hip AVN is possible though would expect more pain with hip motions.  Found to have hernia at [facility] - encouraged her to go ahead with CT of abdomen and pelvis which would also visualize femoral heads.  Quite likely an abdominal or pelvic pathology, not MSK etiology.  Provided scheduling phone number for the CT. - Obtain CT abdomen pelvis previously ordered at [facility] - Continue meloxicam , acetaminophen  PRN - Tramadol  50 mg every 6 hours as needed

## 2024-01-22 NOTE — Patient Instructions (Addendum)
 Call about getting your CT scan set up - this is very important. Ph: [phone] Take tylenol  500mg  1-2 tabs three times a day as needed. Tramadol  up to every 6 hours as needed for severe pain. Ok to continue the meloxicam  daily as well. Heat/ice 15 minutes at a time as needed. I don't think there's a musculoskeletal reason for your pain but the CT scan will show the one thing I'm concerned about (avascular necrosis of the femur).

## 2024-01-23 ENCOUNTER — Encounter: Payer: Self-pay | Admitting: Family Medicine

## 2024-01-25 ENCOUNTER — Ambulatory Visit (INDEPENDENT_AMBULATORY_CARE_PROVIDER_SITE_OTHER)

## 2024-01-25 DIAGNOSIS — M216X2 Other acquired deformities of left foot: Secondary | ICD-10-CM

## 2024-01-25 DIAGNOSIS — M216X1 Other acquired deformities of right foot: Secondary | ICD-10-CM

## 2024-01-25 DIAGNOSIS — M722 Plantar fascial fibromatosis: Secondary | ICD-10-CM

## 2024-01-25 NOTE — Progress Notes (Signed)
 ORTHOTIC DISPENSING:   Reason for Visit:         Fitting and Delivery of Custom Fabricated Foot Orthoses Patient Report:            Patient reports comfort and is satisfied with device.   OBJECTIVE DATA: Patient History / Diagnosis:    No change in pathology Provided Device:                     Functional foot orthoses   GOAL OF ORTHOSIS - Improve gait - Decrease energy expenditure - Improve Balance - Provide Triplanar stability of foot complex - Facilitate motion   ACTIONS PERFORMED Patient was fit with custom foot orthoses   Patient was provided with verbal and written instruction and demonstration regarding wear, care, proper fit, function, and use of the orthosis.    Patient was also provided with verbal instruction regarding how to report any failures or malfunctions of the orthosis and necessary follow up care. Patient was also instructed to contact our office regarding any change in status that may affect the function of the orthosis.   Patient demonstrated understanding of all instructions.

## 2024-01-26 ENCOUNTER — Ambulatory Visit (HOSPITAL_COMMUNITY): Admission: RE | Admit: 2024-01-26 | Discharge: 2024-01-26 | Attending: Family Medicine | Admitting: Family Medicine

## 2024-01-26 DIAGNOSIS — K4190 Unilateral femoral hernia, without obstruction or gangrene, not specified as recurrent: Secondary | ICD-10-CM | POA: Diagnosis not present

## 2024-01-26 DIAGNOSIS — Z419 Encounter for procedure for purposes other than remedying health state, unspecified: Secondary | ICD-10-CM | POA: Diagnosis not present

## 2024-01-26 DIAGNOSIS — R1032 Left lower quadrant pain: Secondary | ICD-10-CM | POA: Diagnosis not present

## 2024-01-26 DIAGNOSIS — K5730 Diverticulosis of large intestine without perforation or abscess without bleeding: Secondary | ICD-10-CM | POA: Diagnosis not present

## 2024-01-29 ENCOUNTER — Telehealth: Payer: Self-pay

## 2024-01-29 ENCOUNTER — Ambulatory Visit: Admitting: Family Medicine

## 2024-01-29 ENCOUNTER — Encounter: Payer: Self-pay | Admitting: Family Medicine

## 2024-01-29 VITALS — BP 110/68 | HR 100 | Ht 64.0 in | Wt 134.0 lb

## 2024-01-29 DIAGNOSIS — K4190 Unilateral femoral hernia, without obstruction or gangrene, not specified as recurrent: Secondary | ICD-10-CM | POA: Insufficient documentation

## 2024-01-29 DIAGNOSIS — M1612 Unilateral primary osteoarthritis, left hip: Secondary | ICD-10-CM | POA: Insufficient documentation

## 2024-01-29 MED ORDER — ABDOMINAL BINDER/ELASTIC MED MISC: 1.0000 | Freq: Every day | 0 refills | Status: AC

## 2024-01-29 NOTE — Patient Instructions (Signed)
 It was wonderful to see you today!  Your CT scan did not show anything trapped, which is good news. You may return to work for now, but do not do any lifting. I will provide a note with a work excuse.   Please call [phone] with any questions about today's appointment.   If you need any additional refills, please call your pharmacy before calling the office.  Lucie Pinal, DO Family Medicine

## 2024-01-29 NOTE — Assessment & Plan Note (Signed)
-   still waiting on follow up with sports med - instructed her to continue using meloxicam  for pain relief

## 2024-01-29 NOTE — Progress Notes (Signed)
° ° °  SUBJECTIVE:   CHIEF COMPLAINT / HPI:   Patient presents for follow up on possible hernia  CT imaging completed and reviewed, no hernia noted but cannot be excluded. Does have disc bulging in L4-L5  Pain has improved with use of meloxicam , she will need work restrictions and excuse for the days she missed.   PERTINENT  PMH / PSH: L hip OA  OBJECTIVE:   BP 110/68   Pulse 100   Ht 5' 4 (1.626 m)   Wt 134 lb (60.8 kg)   SpO2 100%   BMI 23.00 kg/m   General: Well appearing, no distress Respirations: even, unlabored breathing  ASSESSMENT/PLAN:   Assessment & Plan Unilateral femoral hernia without obstruction or gangrene, recurrence not specified -follow up scheduled with general surgery on Wednesday - patient instructed not to lift anything greater than 5 lbs since this exacerbates - abdominal binder ordered, patient instructed to wear it while at work.  Primary osteoarthritis of left hip - still waiting on follow up with sports med - instructed her to continue using meloxicam  for pain relief   Jennifer Pinal, DO [facility]

## 2024-01-29 NOTE — Telephone Encounter (Signed)
 Patient calls nurse line regarding CT results. She states that she has CT scan on Friday and was advised not to go to work until discussing results with doctor.   Patient missed work yesterday and is asking when she should return to work. She will also need a work note for the days she missed.   Scheduled patient this PM with PCP to further discuss results and work restrictions.   Chiquita JAYSON English, RN

## 2024-01-29 NOTE — Assessment & Plan Note (Signed)
-  follow up scheduled with general surgery on Wednesday - patient instructed not to lift anything greater than 5 lbs since this exacerbates - abdominal binder ordered, patient instructed to wear it while at work.

## 2024-01-31 ENCOUNTER — Other Ambulatory Visit: Payer: Self-pay | Admitting: General Surgery

## 2024-01-31 DIAGNOSIS — R1032 Left lower quadrant pain: Secondary | ICD-10-CM | POA: Diagnosis not present

## 2024-03-07 ENCOUNTER — Ambulatory Visit: Admitting: Gastroenterology
